# Patient Record
Sex: Female | Born: 1980 | Race: Black or African American | Hispanic: No | Smoking: Never smoker
Health system: Southern US, Community
[De-identification: ages and names within clinical notes are randomized; demographics above are authoritative.]

## PROBLEM LIST (undated history)

## (undated) DIAGNOSIS — E010 Iodine-deficiency related diffuse (endemic) goiter: Secondary | ICD-10-CM

## (undated) DIAGNOSIS — R638 Other symptoms and signs concerning food and fluid intake: Secondary | ICD-10-CM

## (undated) DIAGNOSIS — O139 Gestational [pregnancy-induced] hypertension without significant proteinuria, unspecified trimester: Secondary | ICD-10-CM

## (undated) DIAGNOSIS — IMO0002 Reserved for concepts with insufficient information to code with codable children: Secondary | ICD-10-CM

## (undated) DIAGNOSIS — N921 Excessive and frequent menstruation with irregular cycle: Secondary | ICD-10-CM

## (undated) DIAGNOSIS — I10 Essential (primary) hypertension: Secondary | ICD-10-CM

## (undated) DIAGNOSIS — N63 Unspecified lump in unspecified breast: Secondary | ICD-10-CM

## (undated) DIAGNOSIS — R600 Localized edema: Secondary | ICD-10-CM

## (undated) DIAGNOSIS — D219 Benign neoplasm of connective and other soft tissue, unspecified: Secondary | ICD-10-CM

## (undated) DIAGNOSIS — Z8742 Personal history of other diseases of the female genital tract: Secondary | ICD-10-CM

## (undated) DIAGNOSIS — Z8619 Personal history of other infectious and parasitic diseases: Secondary | ICD-10-CM

## (undated) DIAGNOSIS — N760 Acute vaginitis: Secondary | ICD-10-CM

## (undated) DIAGNOSIS — B9689 Other specified bacterial agents as the cause of diseases classified elsewhere: Secondary | ICD-10-CM

## (undated) DIAGNOSIS — R102 Pelvic and perineal pain: Secondary | ICD-10-CM

## (undated) DIAGNOSIS — Z22330 Carrier of Group B streptococcus: Secondary | ICD-10-CM

## (undated) HISTORY — PX: FOOT SURGERY: SHX648

## (undated) HISTORY — DX: Reserved for concepts with insufficient information to code with codable children: IMO0002

## (undated) HISTORY — DX: Unspecified lump in unspecified breast: N63.0

## (undated) HISTORY — DX: Personal history of other diseases of the female genital tract: Z87.42

## (undated) HISTORY — PX: LIVER SURGERY: SHX698

## (undated) HISTORY — DX: Iodine-deficiency related diffuse (endemic) goiter: E01.0

## (undated) HISTORY — DX: Localized edema: R60.0

## (undated) HISTORY — DX: Gestational (pregnancy-induced) hypertension without significant proteinuria, unspecified trimester: O13.9

## (undated) HISTORY — DX: Other specified bacterial agents as the cause of diseases classified elsewhere: B96.89

## (undated) HISTORY — DX: Benign neoplasm of connective and other soft tissue, unspecified: D21.9

## (undated) HISTORY — DX: Acute vaginitis: N76.0

## (undated) HISTORY — DX: Pelvic and perineal pain: R10.2

## (undated) HISTORY — DX: Other symptoms and signs concerning food and fluid intake: R63.8

## (undated) HISTORY — DX: Excessive and frequent menstruation with irregular cycle: N92.1

## (undated) HISTORY — DX: Carrier of group B Streptococcus: Z22.330

## (undated) HISTORY — DX: Personal history of other infectious and parasitic diseases: Z86.19

## (undated) HISTORY — PX: CERVICAL POLYPECTOMY: SHX88

---

## 1998-09-18 ENCOUNTER — Other Ambulatory Visit: Admission: RE | Admit: 1998-09-18 | Discharge: 1998-09-18 | Payer: Self-pay | Admitting: Family Medicine

## 2000-01-23 ENCOUNTER — Other Ambulatory Visit: Admission: RE | Admit: 2000-01-23 | Discharge: 2000-01-23 | Payer: Self-pay | Admitting: *Deleted

## 2000-04-24 ENCOUNTER — Emergency Department (HOSPITAL_COMMUNITY): Admission: EM | Admit: 2000-04-24 | Discharge: 2000-04-24 | Payer: Self-pay | Admitting: Emergency Medicine

## 2000-09-01 ENCOUNTER — Encounter: Payer: Self-pay | Admitting: Emergency Medicine

## 2000-09-01 ENCOUNTER — Emergency Department (HOSPITAL_COMMUNITY): Admission: EM | Admit: 2000-09-01 | Discharge: 2000-09-01 | Payer: Self-pay | Admitting: Emergency Medicine

## 2000-09-13 ENCOUNTER — Encounter: Admission: RE | Admit: 2000-09-13 | Discharge: 2000-12-12 | Payer: Self-pay | Admitting: Orthopedic Surgery

## 2001-02-06 DIAGNOSIS — E010 Iodine-deficiency related diffuse (endemic) goiter: Secondary | ICD-10-CM

## 2001-02-06 DIAGNOSIS — N921 Excessive and frequent menstruation with irregular cycle: Secondary | ICD-10-CM

## 2001-02-06 DIAGNOSIS — N63 Unspecified lump in unspecified breast: Secondary | ICD-10-CM

## 2001-02-06 HISTORY — DX: Unspecified lump in unspecified breast: N63.0

## 2001-02-06 HISTORY — DX: Excessive and frequent menstruation with irregular cycle: N92.1

## 2001-02-06 HISTORY — DX: Iodine-deficiency related diffuse (endemic) goiter: E01.0

## 2001-02-14 ENCOUNTER — Encounter: Admission: RE | Admit: 2001-02-14 | Discharge: 2001-02-14 | Payer: Self-pay | Admitting: Obstetrics and Gynecology

## 2001-02-14 ENCOUNTER — Encounter: Payer: Self-pay | Admitting: Obstetrics and Gynecology

## 2001-06-08 HISTORY — PX: BUNIONECTOMY: SHX129

## 2002-02-06 DIAGNOSIS — B9689 Other specified bacterial agents as the cause of diseases classified elsewhere: Secondary | ICD-10-CM

## 2002-02-06 HISTORY — DX: Other specified bacterial agents as the cause of diseases classified elsewhere: B96.89

## 2002-06-08 DIAGNOSIS — Z8619 Personal history of other infectious and parasitic diseases: Secondary | ICD-10-CM

## 2002-06-08 HISTORY — DX: Personal history of other infectious and parasitic diseases: Z86.19

## 2003-01-31 ENCOUNTER — Encounter: Payer: Self-pay | Admitting: Endocrinology

## 2003-01-31 ENCOUNTER — Encounter: Admission: RE | Admit: 2003-01-31 | Discharge: 2003-01-31 | Payer: Self-pay | Admitting: Endocrinology

## 2003-04-13 ENCOUNTER — Other Ambulatory Visit: Admission: RE | Admit: 2003-04-13 | Discharge: 2003-04-13 | Payer: Self-pay | Admitting: Obstetrics and Gynecology

## 2004-04-14 ENCOUNTER — Other Ambulatory Visit: Admission: RE | Admit: 2004-04-14 | Discharge: 2004-04-14 | Payer: Self-pay | Admitting: Obstetrics and Gynecology

## 2004-06-08 DIAGNOSIS — Z8742 Personal history of other diseases of the female genital tract: Secondary | ICD-10-CM

## 2004-06-08 HISTORY — DX: Personal history of other diseases of the female genital tract: Z87.42

## 2004-07-08 ENCOUNTER — Ambulatory Visit: Payer: Self-pay | Admitting: Endocrinology

## 2004-07-10 ENCOUNTER — Ambulatory Visit (HOSPITAL_COMMUNITY): Admission: RE | Admit: 2004-07-10 | Discharge: 2004-07-10 | Payer: Self-pay | Admitting: Endocrinology

## 2005-05-07 ENCOUNTER — Other Ambulatory Visit: Admission: RE | Admit: 2005-05-07 | Discharge: 2005-05-07 | Payer: Self-pay | Admitting: Obstetrics and Gynecology

## 2006-03-23 ENCOUNTER — Other Ambulatory Visit: Admission: RE | Admit: 2006-03-23 | Discharge: 2006-03-23 | Payer: Self-pay | Admitting: Family Medicine

## 2007-03-31 ENCOUNTER — Other Ambulatory Visit: Admission: RE | Admit: 2007-03-31 | Discharge: 2007-03-31 | Payer: Self-pay | Admitting: Family Medicine

## 2007-06-09 DIAGNOSIS — R102 Pelvic and perineal pain: Secondary | ICD-10-CM

## 2007-06-09 HISTORY — DX: Pelvic and perineal pain: R10.2

## 2008-05-10 ENCOUNTER — Other Ambulatory Visit: Admission: RE | Admit: 2008-05-10 | Discharge: 2008-05-10 | Payer: Self-pay | Admitting: Family Medicine

## 2008-06-08 DIAGNOSIS — IMO0002 Reserved for concepts with insufficient information to code with codable children: Secondary | ICD-10-CM

## 2008-06-08 DIAGNOSIS — R87619 Unspecified abnormal cytological findings in specimens from cervix uteri: Secondary | ICD-10-CM

## 2008-06-08 HISTORY — DX: Unspecified abnormal cytological findings in specimens from cervix uteri: R87.619

## 2008-06-08 HISTORY — DX: Reserved for concepts with insufficient information to code with codable children: IMO0002

## 2008-11-13 ENCOUNTER — Ambulatory Visit (HOSPITAL_COMMUNITY): Admission: RE | Admit: 2008-11-13 | Discharge: 2008-11-13 | Payer: Self-pay | Admitting: Family Medicine

## 2008-12-05 ENCOUNTER — Ambulatory Visit (HOSPITAL_COMMUNITY): Admission: RE | Admit: 2008-12-05 | Discharge: 2008-12-05 | Payer: Self-pay | Admitting: Obstetrics and Gynecology

## 2008-12-05 ENCOUNTER — Encounter (INDEPENDENT_AMBULATORY_CARE_PROVIDER_SITE_OTHER): Payer: Self-pay | Admitting: Obstetrics and Gynecology

## 2010-09-15 LAB — CBC
HCT: 36.7 % (ref 36.0–46.0)
Hemoglobin: 12.6 g/dL (ref 12.0–15.0)
MCHC: 34.3 g/dL (ref 30.0–36.0)
MCV: 93.1 fL (ref 78.0–100.0)
Platelets: 383 10*3/uL (ref 150–400)
RBC: 3.94 MIL/uL (ref 3.87–5.11)
RDW: 12.9 % (ref 11.5–15.5)
WBC: 5.4 10*3/uL (ref 4.0–10.5)

## 2010-10-21 NOTE — Op Note (Signed)
Chloe Elliott, Chloe Elliott                ACCOUNT NO.:  [id number]   MEDICAL RECORD NO.:  [id number]          PATIENT TYPE:  AMB   LOCATION:  SDC                           FACILITY:  [facility]   PHYSICIAN:  Dois Davenport A. Rivard, M.D. DATE OF BIRTH:  August 11, 1980   DATE OF PROCEDURE:  12/05/2008  DATE OF DISCHARGE:  12/05/2008                               OPERATIVE REPORT   PREOPERATIVE DIAGNOSIS:  Dysfunctional uterine bleeding with endometrial  polyps.   POSTOPERATIVE DIAGNOSIS:  Dysfunctional uterine bleeding with  endometrial polyps with 1 submucosal fibroid.   ANESTHESIA:  General.   PROCEDURE:  Hysteroscopy, resection of polyps, and submucosal fibroid  dilation and curettage.   SURGEON:  Crist Fat. Rivard, MD   ASSISTANT:  No assistant.   ESTIMATED BLOOD LOSS:  Minimal.   PROCEDURE:  After being informed of the planned procedure with possible  complications including bleeding, infection, and injury to uterus,  informed consent is obtained.  The patient is taken to OR #7 given  general anesthesia with laryngeal mask ventilation without complication.  She is placed in lithotomy position, prepped, and draped in a sterile  fashion, and her bladder is emptied with an in-and-out red rubber  catheter.  Pelvic exam reveals a retroverted uterus, slightly increased  in size approximately 12 weeks, mobile and 2 normal adnexa.  A weighted  speculum is inserted.  Anterior lip of the cervix was grasped with a  tenaculum forceps and we proceed with a paracervical block using  Novocaine 1% 18 mL in the usual fashion.  Uterus was sounded at 10 cm  and the cervix was dilated using Hegar dilator until #29 which allows  easy entry of an operative hysteroscope.  With perfusion of sorbitol at  a maximum pressure of 90 mmHg, we are able to visualize the entire  uterine cavity which reveals a slightly thickened posterior wall  endometrium compared to the anterior wall as well as 4 polyps, one of  them is in the  left cornua, one of them is at the fundus anterior, the  other one is on the left wall of the mid body, and the final one is in  the lower uterine segment on the left side.  All polyps are resected  using resectoscope.  We also note a submucosal fibroid mid body  posteriorly measuring approximately 1.5 cm which is resected in 4 passes  easily.  After resection is completed, we proceed with a sharp curette  and curetting the endometrial cavity removing the reminder of the  endometrial lining and finally a final look in the cavity shows a normal  cavity with no remaining polyps.  Hysteroscope was removed.  The cervix  has to be repaired due to 2 laceration created by the tenaculum and this  was done with 4 figure-of-eight stitches of 2-0 Vicryl.   Instrument and sponge count is complete x2.  Estimated blood loss is  minimal.  Water deficit is 200 mL.  Procedure is well tolerated by the  patient, who is taken to recovery room in a well and stable condition.   SPECIMEN:  Endometrial polyps, endometrial curettings, and fragments of  fibroids are sent to Pathology.      Crist Fat Rivard, M.D.  Electronically Signed     SAR/MEDQ  D:  12/05/2008  T:  12/06/2008  Job:  [id number]

## 2010-11-01 ENCOUNTER — Inpatient Hospital Stay (HOSPITAL_COMMUNITY)
Admission: AD | Admit: 2010-11-01 | Discharge: 2010-11-01 | Disposition: A | Discharge status: Home / Self Care | Payer: 59 | Source: Ambulatory Visit | Attending: Obstetrics and Gynecology | Admitting: Obstetrics and Gynecology

## 2010-11-01 ENCOUNTER — Inpatient Hospital Stay (HOSPITAL_COMMUNITY): Payer: 59

## 2010-11-01 DIAGNOSIS — O209 Hemorrhage in early pregnancy, unspecified: Secondary | ICD-10-CM | POA: Insufficient documentation

## 2010-11-01 LAB — CBC
HCT: 37.5 % (ref 36.0–46.0)
MCHC: 33.9 g/dL (ref 30.0–36.0)
MCV: 90.8 fL (ref 78.0–100.0)
Platelets: 324 10*3/uL (ref 150–400)
RDW: 11.9 % (ref 11.5–15.5)
WBC: 5.6 10*3/uL (ref 4.0–10.5)

## 2010-11-01 LAB — URINALYSIS, ROUTINE W REFLEX MICROSCOPIC
Bilirubin Urine: NEGATIVE
Ketones, ur: NEGATIVE mg/dL
Nitrite: NEGATIVE
Protein, ur: NEGATIVE mg/dL
Specific Gravity, Urine: 1.02 (ref 1.005–1.030)
Urobilinogen, UA: 0.2 mg/dL (ref 0.0–1.0)

## 2010-11-01 LAB — HEPATITIS B SURFACE ANTIGEN: Hepatitis B Surface Ag: NEGATIVE

## 2010-11-01 LAB — TYPE AND SCREEN: Antibody Screen: NEGATIVE

## 2010-11-01 LAB — WET PREP, GENITAL
Trich, Wet Prep: NONE SEEN
Yeast Wet Prep HPF POC: NONE SEEN

## 2010-11-04 DIAGNOSIS — IMO0002 Reserved for concepts with insufficient information to code with codable children: Secondary | ICD-10-CM

## 2010-11-04 HISTORY — DX: Reserved for concepts with insufficient information to code with codable children: IMO0002

## 2010-11-04 LAB — GC/CHLAMYDIA PROBE AMP, GENITAL: Chlamydia, DNA Probe: NEGATIVE

## 2011-01-08 LAB — ABO/RH: RH Type: POSITIVE

## 2011-01-08 LAB — HIV ANTIBODY (ROUTINE TESTING W REFLEX): HIV: NONREACTIVE

## 2011-01-08 LAB — RPR: RPR: NONREACTIVE

## 2011-01-08 LAB — RUBELLA ANTIBODY, IGM: Rubella: IMMUNE

## 2011-04-16 ENCOUNTER — Inpatient Hospital Stay (HOSPITAL_COMMUNITY)
Admission: AD | Admit: 2011-04-16 | Discharge: 2011-04-16 | Disposition: A | Discharge status: Home / Self Care | Payer: 59 | Source: Ambulatory Visit | Attending: Obstetrics and Gynecology | Admitting: Obstetrics and Gynecology

## 2011-04-16 ENCOUNTER — Encounter (HOSPITAL_COMMUNITY): Payer: Self-pay

## 2011-04-16 ENCOUNTER — Inpatient Hospital Stay (HOSPITAL_COMMUNITY): Payer: 59

## 2011-04-16 DIAGNOSIS — O26859 Spotting complicating pregnancy, unspecified trimester: Secondary | ICD-10-CM | POA: Insufficient documentation
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
## 2011-04-16 NOTE — Progress Notes (Signed)
Pt states noticed small amount of pink spotting on toilet paper today x1 episode and a small clot in the toilet. Also states have had some braxton hicks today that have not been painful. Denies leaking of fluid or vaginal discharge. Reports positive fetal movement. Was at office today but it was too late for an ultrasound & the doctor couldn't "find her cervix".

## 2011-05-14 ENCOUNTER — Inpatient Hospital Stay (HOSPITAL_COMMUNITY)
Admission: AD | Admit: 2011-05-14 | Discharge: 2011-05-14 | Disposition: A | Discharge status: Home / Self Care | Payer: 59 | Source: Ambulatory Visit | Attending: Obstetrics and Gynecology | Admitting: Obstetrics and Gynecology

## 2011-05-14 ENCOUNTER — Encounter (HOSPITAL_COMMUNITY): Payer: Self-pay

## 2011-05-14 ENCOUNTER — Other Ambulatory Visit: Payer: Self-pay | Admitting: Obstetrics and Gynecology

## 2011-05-14 DIAGNOSIS — Z22330 Carrier of Group B streptococcus: Secondary | ICD-10-CM

## 2011-05-14 DIAGNOSIS — IMO0002 Reserved for concepts with insufficient information to code with codable children: Secondary | ICD-10-CM | POA: Insufficient documentation

## 2011-05-14 DIAGNOSIS — R600 Localized edema: Secondary | ICD-10-CM

## 2011-05-14 DIAGNOSIS — Z3400 Encounter for supervision of normal first pregnancy, unspecified trimester: Secondary | ICD-10-CM

## 2011-05-14 LAB — CBC
HCT: 33.3 % — ABNORMAL LOW (ref 36.0–46.0)
Hemoglobin: 11.3 g/dL — ABNORMAL LOW (ref 12.0–15.0)
MCH: 31.7 pg (ref 26.0–34.0)
MCHC: 33.9 g/dL (ref 30.0–36.0)
MCV: 93.3 fL (ref 78.0–100.0)
RDW: 12.7 % (ref 11.5–15.5)

## 2011-05-14 LAB — COMPREHENSIVE METABOLIC PANEL
Alkaline Phosphatase: 78 U/L (ref 39–117)
BUN: 5 mg/dL — ABNORMAL LOW (ref 6–23)
Creatinine, Ser: 0.57 mg/dL (ref 0.50–1.10)
GFR calc Af Amer: >90 mL/min (ref >90)
Glucose, Bld: 109 mg/dL — ABNORMAL HIGH (ref 70–99)
Potassium: 3.6 mEq/L (ref 3.5–5.1)
Total Protein: 6.8 g/dL (ref 6.0–8.3)

## 2011-05-14 LAB — LACTATE DEHYDROGENASE: LDH: 211 U/L (ref 94–250)

## 2011-05-14 LAB — URIC ACID: Uric Acid, Serum: 3.5 mg/dL (ref 2.4–7.0)
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
## 2011-05-14 NOTE — Progress Notes (Signed)
Patient states she was in the office for a routine visit. Blood pressure was elevated and was sent to MAU for evaluation. Reports good fetal movement.

## 2011-05-14 NOTE — Progress Notes (Signed)
Pt sent from MD office for [facility] eval, labs already drawn, denies h/a, dizziness, blurred vision, no pain, +FM per pt.

## 2011-06-05 ENCOUNTER — Encounter (HOSPITAL_COMMUNITY): Payer: Self-pay | Admitting: *Deleted

## 2011-06-05 ENCOUNTER — Inpatient Hospital Stay (HOSPITAL_COMMUNITY)
Admission: AD | Admit: 2011-06-05 | Discharge: 2011-06-05 | Disposition: A | Discharge status: Home / Self Care | Payer: 59 | Source: Ambulatory Visit | Attending: Obstetrics and Gynecology | Admitting: Obstetrics and Gynecology

## 2011-06-05 DIAGNOSIS — R030 Elevated blood-pressure reading, without diagnosis of hypertension: Secondary | ICD-10-CM | POA: Insufficient documentation

## 2011-06-05 DIAGNOSIS — O139 Gestational [pregnancy-induced] hypertension without significant proteinuria, unspecified trimester: Secondary | ICD-10-CM | POA: Insufficient documentation

## 2011-06-05 DIAGNOSIS — IMO0002 Reserved for concepts with insufficient information to code with codable children: Secondary | ICD-10-CM

## 2011-06-05 DIAGNOSIS — O2690 Pregnancy related conditions, unspecified, unspecified trimester: Secondary | ICD-10-CM | POA: Insufficient documentation

## 2011-06-05 HISTORY — DX: Gestational (pregnancy-induced) hypertension without significant proteinuria, unspecified trimester: O13.9

## 2011-06-05 LAB — DIFFERENTIAL
Basophils Relative: 0 % (ref 0–1)
Eosinophils Absolute: 0.1 10*3/uL (ref 0.0–0.7)
Eosinophils Relative: 2 % (ref 0–5)
Lymphs Abs: 1.5 10*3/uL (ref 0.7–4.0)
Monocytes Absolute: 0.8 10*3/uL (ref 0.1–1.0)
Monocytes Relative: 9 % (ref 3–12)
Neutrophils Relative %: 74 % (ref 43–77)

## 2011-06-05 LAB — COMPREHENSIVE METABOLIC PANEL
ALT: 15 U/L (ref 0–35)
AST: 19 U/L (ref 0–37)
Albumin: 2.6 g/dL — ABNORMAL LOW (ref 3.5–5.2)
Alkaline Phosphatase: 87 U/L (ref 39–117)
Calcium: 9.4 mg/dL (ref 8.4–10.5)
GFR calc Af Amer: >90 mL/min (ref >90)
Glucose, Bld: 83 mg/dL (ref 70–99)
Potassium: 3.6 mEq/L (ref 3.5–5.1)
Sodium: 135 mEq/L (ref 135–145)
Total Protein: 6.2 g/dL (ref 6.0–8.3)

## 2011-06-05 LAB — URINALYSIS, ROUTINE W REFLEX MICROSCOPIC
Glucose, UA: 500 mg/dL — AB
Hgb urine dipstick: NEGATIVE
Ketones, ur: NEGATIVE mg/dL
Leukocytes, UA: NEGATIVE
Protein, ur: NEGATIVE mg/dL
pH: 6 (ref 5.0–8.0)

## 2011-06-05 LAB — CBC
HCT: 32.3 % — ABNORMAL LOW (ref 36.0–46.0)
Hemoglobin: 11.3 g/dL — ABNORMAL LOW (ref 12.0–15.0)
MCH: 32.4 pg (ref 26.0–34.0)
MCHC: 35 g/dL (ref 30.0–36.0)
MCV: 92.6 fL (ref 78.0–100.0)
RBC: 3.49 MIL/uL — ABNORMAL LOW (ref 3.87–5.11)

## 2011-06-05 NOTE — Progress Notes (Signed)
Patient was sent from the office for evaluation of elevated blood pressure. Reports good fetal movement, no contractions, bleeding or leaking. Does have some lower abdominal pressure.
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
## 2011-06-06 LAB — CREATININE CLEARANCE, URINE, 24 HOUR
Creatinine, 24H Ur: 2295 mg/d — ABNORMAL HIGH (ref 700–1800)
Creatinine, Urine: 95.64 mg/dL
Creatinine: 0.56 mg/dL (ref 0.50–1.10)

## 2011-06-07 LAB — PROTEIN, URINE, 24 HOUR: Urine Total Volume-UPROT: 2400 mL

## 2011-06-09 DIAGNOSIS — R638 Other symptoms and signs concerning food and fluid intake: Secondary | ICD-10-CM

## 2011-06-09 HISTORY — DX: Other symptoms and signs concerning food and fluid intake: R63.8

## 2011-06-09 NOTE — L&D Delivery Note (Signed)
Delivery Note At 5:43 PM a viable female was delivered via Vaginal, in L tilt position with McRoberts after 2 hours pushing. Spontaneous Delivery (Presentation: Left Occiput Anterior).  APGAR: 9, 9; weight 7 lb (3175 g).   Placenta status: Intact, Spontaneous, schultze Cord: 3 vessels with the following complications:Iand O cath 150 ml at delivery. Cord pH: na. Dr. Pennie Rushing attended delivery.  Anesthesia: Epidural  Episiotomy: Median Lacerations:  Suture Repair: 3.0 chromic Est. Blood Loss (mL): 500  Mom to postpartum.  Baby to rooming in skin to skin.  Chloe Elliott 06/14/2011, 6:29 PM

## 2011-06-13 ENCOUNTER — Encounter (HOSPITAL_COMMUNITY): Payer: Self-pay | Admitting: *Deleted

## 2011-06-13 ENCOUNTER — Inpatient Hospital Stay (HOSPITAL_COMMUNITY)
Admission: AD | Admit: 2011-06-13 | Discharge: 2011-06-16 | DRG: 775 | Disposition: A | Discharge status: Home / Self Care | Payer: 59 | Source: Ambulatory Visit | Attending: Obstetrics and Gynecology | Admitting: Obstetrics and Gynecology

## 2011-06-13 DIAGNOSIS — IMO0002 Reserved for concepts with insufficient information to code with codable children: Secondary | ICD-10-CM | POA: Diagnosis present

## 2011-06-13 DIAGNOSIS — O4290 Premature rupture of membranes, unspecified as to length of time between rupture and onset of labor, unspecified weeks of gestation: Principal | ICD-10-CM | POA: Diagnosis present

## 2011-06-13 DIAGNOSIS — O139 Gestational [pregnancy-induced] hypertension without significant proteinuria, unspecified trimester: Secondary | ICD-10-CM | POA: Diagnosis not present

## 2011-06-13 DIAGNOSIS — Z22330 Carrier of Group B streptococcus: Secondary | ICD-10-CM

## 2011-06-13 DIAGNOSIS — O9081 Anemia of the puerperium: Secondary | ICD-10-CM | POA: Diagnosis not present

## 2011-06-13 DIAGNOSIS — R030 Elevated blood-pressure reading, without diagnosis of hypertension: Secondary | ICD-10-CM | POA: Diagnosis present

## 2011-06-13 DIAGNOSIS — O99892 Other specified diseases and conditions complicating childbirth: Secondary | ICD-10-CM | POA: Diagnosis present

## 2011-06-13 LAB — CBC
MCH: 32.6 pg (ref 26.0–34.0)
MCHC: 34.9 g/dL (ref 30.0–36.0)
MCV: 93.5 fL (ref 78.0–100.0)
Platelets: 264 10*3/uL (ref 150–400)
RBC: 3.71 MIL/uL — ABNORMAL LOW (ref 3.87–5.11)

## 2011-06-13 LAB — AMNISURE RUPTURE OF MEMBRANE (ROM) NOT AT ARMC: Amnisure ROM: POSITIVE

## 2011-06-13 MED ORDER — OXYCODONE-ACETAMINOPHEN 5-325 MG PO TABS: 2.0000 | ORAL_TABLET | ORAL | Status: DC | PRN

## 2011-06-13 MED ORDER — LIDOCAINE HCL (PF) 1 % IJ SOLN
30.0000 mL | INTRAMUSCULAR | Status: DC | PRN
  Filled 2011-06-13: qty 30

## 2011-06-13 MED ORDER — BISACODYL 10 MG RE SUPP
10.0000 mg | RECTAL | Status: AC
  Administered 2011-06-13: 10 mg via RECTAL
  Filled 2011-06-13: qty 1

## 2011-06-13 MED ORDER — BUTORPHANOL TARTRATE 2 MG/ML IJ SOLN
2.0000 mg | INTRAMUSCULAR | Status: DC | PRN
  Administered 2011-06-14: 2 mg via INTRAVENOUS
  Filled 2011-06-13: qty 1

## 2011-06-13 MED ORDER — MISOPROSTOL 25 MCG QUARTER TABLET: 50.0000 ug | ORAL_TABLET | ORAL | Status: DC

## 2011-06-13 MED ORDER — IBUPROFEN 600 MG PO TABS: 600.0000 mg | ORAL_TABLET | Freq: Four times a day (QID) | ORAL | Status: DC | PRN

## 2011-06-13 MED ORDER — CITRIC ACID-SODIUM CITRATE 334-500 MG/5ML PO SOLN
30.0000 mL | ORAL | Status: DC | PRN
  Filled 2011-06-13: qty 15

## 2011-06-13 MED ORDER — ACETAMINOPHEN 325 MG PO TABS: 650.0000 mg | ORAL_TABLET | ORAL | Status: DC | PRN

## 2011-06-13 MED ORDER — PENICILLIN G POTASSIUM 5000000 UNITS IJ SOLR
2.5000 10*6.[IU] | INTRAVENOUS | Status: DC
  Administered 2011-06-13 – 2011-06-14 (×6): 2.5 10*6.[IU] via INTRAVENOUS
  Filled 2011-06-13 (×10): qty 2.5

## 2011-06-13 MED ORDER — PROMETHAZINE HCL 25 MG/ML IJ SOLN: 12.5000 mg | INTRAMUSCULAR | Status: DC | PRN

## 2011-06-13 MED ORDER — OXYTOCIN BOLUS FROM INFUSION
500.0000 mL | Freq: Once | INTRAVENOUS | Status: DC
  Filled 2011-06-13: qty 500

## 2011-06-13 MED ORDER — TERBUTALINE SULFATE 1 MG/ML IJ SOLN: 0.2500 mg | Freq: Once | INTRAMUSCULAR | Status: AC | PRN

## 2011-06-13 MED ORDER — MISOPROSTOL 25 MCG QUARTER TABLET
50.0000 ug | ORAL_TABLET | ORAL | Status: AC
  Administered 2011-06-13 – 2011-06-14 (×2): 50 ug via ORAL
  Filled 2011-06-13 (×2): qty 0.5

## 2011-06-13 MED ORDER — ZOLPIDEM TARTRATE 10 MG PO TABS
10.0000 mg | ORAL_TABLET | Freq: Once | ORAL | Status: AC
Start: 2011-06-13 — End: 2011-06-13
  Administered 2011-06-13: 10 mg via ORAL
  Filled 2011-06-13: qty 1

## 2011-06-13 MED ORDER — LACTATED RINGERS IV SOLN: 500.0000 mL | INTRAVENOUS | Status: DC | PRN

## 2011-06-13 MED ORDER — LACTATED RINGERS IV SOLN
INTRAVENOUS | Status: DC
  Administered 2011-06-13 – 2011-06-14 (×4): via INTRAVENOUS

## 2011-06-13 MED ORDER — OXYTOCIN 20 UNITS IN LACTATED RINGERS INFUSION - SIMPLE: 1.0000 m[IU]/min | INTRAVENOUS | Status: DC

## 2011-06-13 MED ORDER — ONDANSETRON HCL 4 MG/2ML IJ SOLN
4.0000 mg | Freq: Four times a day (QID) | INTRAMUSCULAR | Status: DC | PRN
  Administered 2011-06-14: 4 mg via INTRAVENOUS
  Filled 2011-06-13: qty 2

## 2011-06-13 MED ORDER — OXYTOCIN 20 UNITS IN LACTATED RINGERS INFUSION - SIMPLE
125.0000 mL/h | Freq: Once | INTRAVENOUS | Status: AC
  Administered 2011-06-14: 125 mL/h via INTRAVENOUS

## 2011-06-13 MED ORDER — FLEET ENEMA 7-19 GM/118ML RE ENEM: 1.0000 | ENEMA | RECTAL | Status: DC | PRN

## 2011-06-13 MED ORDER — PENICILLIN G POTASSIUM 5000000 UNITS IJ SOLR
5.0000 10*6.[IU] | Freq: Once | INTRAVENOUS | Status: AC
  Administered 2011-06-13: 5 10*6.[IU] via INTRAVENOUS
  Filled 2011-06-13: qty 5

## 2011-06-13 MED ORDER — DINOPROSTONE 10 MG VA INST
10.0000 mg | VAGINAL_INSERT | Freq: Once | VAGINAL | Status: AC
  Administered 2011-06-13: 10 mg via VAGINAL
  Filled 2011-06-13: qty 1

## 2011-06-13 NOTE — Progress Notes (Signed)
Comfortable some cramping with voids only O VSS      Fhts 130-140s LTV mod accels      uc q 2-4 to irritability difficult tracing      Vag not reassessed A   PPROM x 12 hours      Cervical ripening P   Continue care, Dr. Estanislado Pandy updated per telephone on pt status plan to re evaluate with removal of cervidil due out at 2240. Lavera Guise, CNM

## 2011-06-13 NOTE — Progress Notes (Signed)
Monitoring off per CNM for pt to rest--will reapply after an hour of being off--then monitoring changed to pt having 20 mins off monitors per hour--per Corrie Dandy, CNM

## 2011-06-13 NOTE — Progress Notes (Signed)
1930 Patient walking in hallway. 1945 patient returned to room and her meal was waiting for her. She requested that we delay assessment until she finishes eating. Spoke with Chip Boer, she will check patient at 2130 and remove cervidil. Plan of care to be determined at that time. Patient may remain off the monitor until 2130 or exam is complete. Candise Che, RN

## 2011-06-13 NOTE — Progress Notes (Signed)
Report called to [facility] in BS. Pt to BS ambulatory with NT.

## 2011-06-13 NOTE — Progress Notes (Signed)
  Subjective: Doing well.  Occasional contractions.  Family at bedside.  Objective: BP 143/77  Pulse 96  Temp(Src) 98.4 F (36.9 C) (Oral)  Resp 18  Ht 5\' 9"  (1.753 m)  Wt 121.11 kg (267 lb)  BMI 39.43 kg/m2      FHT:  Category 1  UC:   Very occasional  Labs: Lab Results  Component Value Date   WBC 9.4 06/13/2011   HGB 12.1 06/13/2011   HCT 34.7* 06/13/2011   MCV 93.5 06/13/2011   PLT 264 06/13/2011    Assessment / Plan: IUP at 37 2/7weeks PROM without labor, unfavorable cervix + GBS--on prophylaxis  Plan: Will re-evaluate cervix with removal of Cervidil at 10:40pm--anticipate further ripening.   Chloe Elliott 06/13/2011, 8:34 PM
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
## 2011-06-13 NOTE — Progress Notes (Signed)
  Subjective: Denies contractions.  Leaking small amount clear fluid.  Feeling some pressure in rectum.  Objective: BP 128/79  Pulse 95  Temp(Src) 98.3 F (36.8 C) (Oral)  Resp 18  Ht 5\' 9"  (1.753 m)  Wt 121.11 kg (267 lb)  BMI 39.43 kg/m2      FHT:  Category 1 UC:   none SVE:  Closed, thick, posterior, firm.  Moderate amount stool in rectum Cervidil removed.  Assessment / Plan: PPROM x 16-17 hours, no labor Minimal cervical ripening with Cervidil Category 1 FHR Constipation  Plan: Will use po Cytotech 50 mcg q 4 hours for at least 2 doses, then re-evaluate for 3rd dose vs initiation of pitocin. Dulcolax suppository now Ambien after suppository results. Re-evaluate in am or prn. Patient and husband agreeable with plan.   Nigel Bridgeman  06/13/2011, 10:09 PM

## 2011-06-13 NOTE — Progress Notes (Signed)
G1 at 37.2wks. Leaking fld since 0549. Clear fld. Some mild cramping. B/P elevated some at end of pregnancy

## 2011-06-14 ENCOUNTER — Encounter (HOSPITAL_COMMUNITY): Payer: Self-pay | Admitting: Anesthesiology

## 2011-06-14 ENCOUNTER — Encounter (HOSPITAL_COMMUNITY): Payer: Self-pay | Admitting: *Deleted

## 2011-06-14 ENCOUNTER — Inpatient Hospital Stay (HOSPITAL_COMMUNITY): Payer: 59 | Admitting: Anesthesiology

## 2011-06-14 LAB — CBC
MCH: 32.2 pg (ref 26.0–34.0)
MCHC: 34.6 g/dL (ref 30.0–36.0)
Platelets: 246 10*3/uL (ref 150–400)
RDW: 12.9 % (ref 11.5–15.5)

## 2011-06-14 MED ORDER — ONDANSETRON HCL 4 MG PO TABS: 4.0000 mg | ORAL_TABLET | ORAL | Status: DC | PRN

## 2011-06-14 MED ORDER — PRENATAL PLUS 27-1 MG PO TABS: 1.0000 | ORAL_TABLET | Freq: Every day | ORAL | Status: DC

## 2011-06-14 MED ORDER — OXYTOCIN 20 UNITS IN LACTATED RINGERS INFUSION - SIMPLE: 1.0000 m[IU]/min | INTRAVENOUS | Status: DC

## 2011-06-14 MED ORDER — FENTANYL 2.5 MCG/ML BUPIVACAINE 1/10 % EPIDURAL INFUSION (WH - ANES)
14.0000 mL/h | INTRAMUSCULAR | Status: DC
  Administered 2011-06-14 (×2): 14 mL/h via EPIDURAL
  Filled 2011-06-14 (×3): qty 60

## 2011-06-14 MED ORDER — BENZOCAINE-MENTHOL 20-0.5 % EX AERO
INHALATION_SPRAY | CUTANEOUS | Status: AC
  Administered 2011-06-15: 02:00:00
  Filled 2011-06-14: qty 56

## 2011-06-14 MED ORDER — PRENATAL MULTIVITAMIN CH
1.0000 | ORAL_TABLET | Freq: Every day | ORAL | Status: DC
  Administered 2011-06-16: 1 via ORAL
  Filled 2011-06-14: qty 1

## 2011-06-14 MED ORDER — WITCH HAZEL-GLYCERIN EX PADS: 1.0000 "application " | MEDICATED_PAD | CUTANEOUS | Status: DC | PRN

## 2011-06-14 MED ORDER — OXYTOCIN 20 UNITS IN LACTATED RINGERS INFUSION - SIMPLE
1.0000 m[IU]/min | INTRAVENOUS | Status: DC
  Administered 2011-06-14: 1 m[IU]/min via INTRAVENOUS
  Filled 2011-06-14: qty 1000

## 2011-06-14 MED ORDER — SODIUM BICARBONATE 8.4 % IV SOLN
INTRAVENOUS | Status: AC
  Filled 2011-06-14: qty 50

## 2011-06-14 MED ORDER — EPHEDRINE 5 MG/ML INJ
10.0000 mg | INTRAVENOUS | Status: DC | PRN
  Filled 2011-06-14: qty 4

## 2011-06-14 MED ORDER — LANOLIN HYDROUS EX OINT: TOPICAL_OINTMENT | CUTANEOUS | Status: DC | PRN

## 2011-06-14 MED ORDER — DIPHENHYDRAMINE HCL 25 MG PO CAPS: 25.0000 mg | ORAL_CAPSULE | Freq: Four times a day (QID) | ORAL | Status: DC | PRN

## 2011-06-14 MED ORDER — FENTANYL 2.5 MCG/ML BUPIVACAINE 1/10 % EPIDURAL INFUSION (WH - ANES)
INTRAMUSCULAR | Status: DC | PRN
  Administered 2011-06-14: 14 mL/h via EPIDURAL

## 2011-06-14 MED ORDER — ZOLPIDEM TARTRATE 5 MG PO TABS: 5.0000 mg | ORAL_TABLET | Freq: Every evening | ORAL | Status: DC | PRN

## 2011-06-14 MED ORDER — PHENYLEPHRINE 40 MCG/ML (10ML) SYRINGE FOR IV PUSH (FOR BLOOD PRESSURE SUPPORT)
80.0000 ug | PREFILLED_SYRINGE | INTRAVENOUS | Status: DC | PRN
  Filled 2011-06-14: qty 5

## 2011-06-14 MED ORDER — SENNOSIDES-DOCUSATE SODIUM 8.6-50 MG PO TABS
2.0000 | ORAL_TABLET | Freq: Every day | ORAL | Status: DC
  Administered 2011-06-15: 2 via ORAL

## 2011-06-14 MED ORDER — LIDOCAINE HCL 1.5 % IJ SOLN
INTRAMUSCULAR | Status: DC | PRN
  Administered 2011-06-14 (×2): 4 mL via EPIDURAL

## 2011-06-14 MED ORDER — ONDANSETRON HCL 4 MG/2ML IJ SOLN: 4.0000 mg | INTRAMUSCULAR | Status: DC | PRN

## 2011-06-14 MED ORDER — FLEET ENEMA 7-19 GM/118ML RE ENEM
1.0000 | ENEMA | RECTAL | Status: AC
  Administered 2011-06-14: 1 via RECTAL

## 2011-06-14 MED ORDER — OXYCODONE-ACETAMINOPHEN 5-325 MG PO TABS
1.0000 | ORAL_TABLET | ORAL | Status: DC | PRN
  Administered 2011-06-15: 1 via ORAL
  Filled 2011-06-14 (×2): qty 1

## 2011-06-14 MED ORDER — LIDOCAINE-EPINEPHRINE (PF) 2 %-1:200000 IJ SOLN
INTRAMUSCULAR | Status: AC
  Filled 2011-06-14: qty 20

## 2011-06-14 MED ORDER — PHENYLEPHRINE 40 MCG/ML (10ML) SYRINGE FOR IV PUSH (FOR BLOOD PRESSURE SUPPORT): 80.0000 ug | PREFILLED_SYRINGE | INTRAVENOUS | Status: DC | PRN

## 2011-06-14 MED ORDER — DIBUCAINE 1 % RE OINT: 1.0000 "application " | TOPICAL_OINTMENT | RECTAL | Status: DC | PRN

## 2011-06-14 MED ORDER — SIMETHICONE 80 MG PO CHEW: 80.0000 mg | CHEWABLE_TABLET | ORAL | Status: DC | PRN

## 2011-06-14 MED ORDER — EPHEDRINE 5 MG/ML INJ: 10.0000 mg | INTRAVENOUS | Status: DC | PRN

## 2011-06-14 MED ORDER — DIPHENHYDRAMINE HCL 50 MG/ML IJ SOLN: 12.5000 mg | INTRAMUSCULAR | Status: DC | PRN

## 2011-06-14 MED ORDER — BENZOCAINE-MENTHOL 20-0.5 % EX AERO
1.0000 "application " | INHALATION_SPRAY | CUTANEOUS | Status: DC | PRN
  Administered 2011-06-16: 1 via TOPICAL

## 2011-06-14 MED ORDER — MISOPROSTOL 200 MCG PO TABS
ORAL_TABLET | ORAL | Status: AC
Start: 2011-06-14 — End: 2011-06-15
  Filled 2011-06-14: qty 5

## 2011-06-14 MED ORDER — SODIUM BICARBONATE 8.4 % IV SOLN
INTRAVENOUS | Status: DC | PRN
  Administered 2011-06-14: 8 mL via EPIDURAL

## 2011-06-14 MED ORDER — TETANUS-DIPHTH-ACELL PERTUSSIS 5-2.5-18.5 LF-MCG/0.5 IM SUSP
0.5000 mL | Freq: Once | INTRAMUSCULAR | Status: AC
  Administered 2011-06-16: 0.5 mL via INTRAMUSCULAR
  Filled 2011-06-14: qty 0.5

## 2011-06-14 MED ORDER — LACTATED RINGERS IV SOLN
500.0000 mL | Freq: Once | INTRAVENOUS | Status: AC
  Administered 2011-06-14: 500 mL via INTRAVENOUS

## 2011-06-14 MED ORDER — IBUPROFEN 600 MG PO TABS
600.0000 mg | ORAL_TABLET | Freq: Four times a day (QID) | ORAL | Status: DC
  Administered 2011-06-15 – 2011-06-16 (×7): 600 mg via ORAL
  Filled 2011-06-14 (×7): qty 1

## 2011-06-14 NOTE — Progress Notes (Signed)
S:  Just received IV pain medication--had moderate amount stool after enema.  Very sleepy.  O:  VSS. Afebrile.       FHR Category 1       UCs irregular at present, not tracing well, palpate as mild.  A:  PPROM x 24 hours      Early labor, s/p Cervidil and 2 doses oral cytotech (LD 2:30am)      Positive GBS  P:  Start pitocin per low-dose protocol this am       Further pain medication as needed.  Nigel Bridgeman, CNM, [place] 06/14/11 6:20am

## 2011-06-14 NOTE — Progress Notes (Signed)
Pressure with contractions O Fhts 120-130s LTV min to mod early decels     uc q 2-4 mod     Vag C +2 little movement with pushing A 2nd stage P labor down Lavera Guise, CNM

## 2011-06-14 NOTE — Anesthesia Preprocedure Evaluation (Addendum)
Anesthesia Evaluation  Patient identified by MRN, date of birth, ID band Patient awake    Reviewed: Allergy & Precautions, H&P , Patient's Chart, lab work & pertinent test results  Airway Mallampati: III TM Distance: >3 FB Neck ROM: full    Dental No notable dental hx. (+) Teeth Intact   Pulmonary neg pulmonary ROS,  clear to auscultation  Pulmonary exam normal       Cardiovascular hypertension, On Medications and Pt. on medications neg cardio ROS regular Normal    Neuro/Psych Negative Neurological ROS  Negative Psych ROS   GI/Hepatic negative GI ROS, Neg liver ROS,   Endo/Other  Negative Endocrine ROSMorbid obesity  Renal/GU negative Renal ROS  Genitourinary negative   Musculoskeletal   Abdominal Normal abdominal exam  (+)   Peds  Hematology negative hematology ROS (+)   Anesthesia Other Findings   Reproductive/Obstetrics (+) Pregnancy                          Anesthesia Physical Anesthesia Plan  ASA: III  Anesthesia Plan: Epidural   Post-op Pain Management:    Induction:   Airway Management Planned:   Additional Equipment:   Intra-op Plan:   Post-operative Plan:   Informed Consent: I have reviewed the patients History and Physical, chart, labs and discussed the procedure including the risks, benefits and alternatives for the proposed anesthesia with the patient or authorized representative who has indicated his/her understanding and acceptance.     Plan Discussed with: Anesthesiologist and Surgeon  Anesthesia Plan Comments:         Anesthesia Quick Evaluation

## 2011-06-14 NOTE — Anesthesia Procedure Notes (Signed)
Epidural Patient location during procedure: OB Start time: 06/14/2011 8:51 AM  Staffing Anesthesiologist: Breanda Greenlaw A. Performed by: anesthesiologist   Preanesthetic Checklist Completed: patient identified, site marked, surgical consent, pre-op evaluation, timeout performed, IV checked, risks and benefits discussed and monitors and equipment checked  Epidural Patient position: sitting Prep: site prepped and draped and DuraPrep Patient monitoring: continuous pulse ox and blood pressure Approach: midline Injection technique: LOR air  Needle:  Needle type: Tuohy  Needle gauge: 17 G Needle length: 9 cm Needle insertion depth: 7 cm Catheter type: closed end flexible Catheter size: 19 Gauge Catheter at skin depth: 12 cm Test dose: negative and 1.5% lidocaine  Assessment Events: blood not aspirated, injection not painful, no injection resistance, negative IV test and no paresthesia  Additional Notes Patient is more comfortable after epidural dosed. Please see RN's note for documentation of vital signs and FHR which are stable.

## 2011-06-14 NOTE — Progress Notes (Signed)
Complaints of L sided pain low with contractions O Fhts 140s LTV mod     uc q 1-4 mod     Vag 8 100 0 VTX R clear A  Active labor     PPROM 31 hours P epidural redose and labor down discussed. Lavera Guise, CNM

## 2011-06-14 NOTE — Progress Notes (Signed)
  Subjective: Sleeping at intervals.  Had good results from Dulcolax suppository.  No significant contractions.  Objective: BP 133/79  Pulse 89  Temp(Src) 98.3 F (36.8 C) (Oral)  Resp 18  Ht 5\' 9"  (1.753 m)  Wt 121.11 kg (267 lb)  BMI 39.43 kg/m2      FHT:  Category 1 tracing UC:   Very occasional Addendum to last note:  Vtx -1 station  Assessment / Plan: PPROM, s/p 10 hours cervidil, 1st dose oral cytotech at 10:30pm 2nd dose cytotech at 2:30am. If no significant change in contraction pattern, will give 3rd dose cytotech at 6:30am.  Chloe Elliott 06/14/2011, 2:15 AM

## 2011-06-14 NOTE — Progress Notes (Signed)
S:  More uncomfortable now--began to have more contractions around 3am, got up and walked for approx 1 hour, now returned to bed for evaluation.  Still feels stool in rectum.  Received 2nd dose oral cytotech at 2:30am.  O:   Filed Vitals:   06/14/11 0100 06/14/11 0130 06/14/11 0200 06/14/11 0230  BP: 141/81 133/79 111/59 129/79  Pulse: 97 89 79 81  Temp:      TempSrc:      Resp: 18 18 18 18   Height:      Weight:       Monitor reapplied--FHR 150. UCs q 2-4 min per palpation  Cx posterior, 2 cm, 80%, vtx -1--hard stool still present in rectum, unable to express with vaginal exam.  A:  PPROM, now approx 23 hours post-rupture  P:  Will do Fleets enema, then consider IV pain medication if desired.       Patient plans epidural as labor advances.  Nigel Bridgeman, CNM, [place] 06/14/11 5am

## 2011-06-14 NOTE — Progress Notes (Signed)
Patient up to bathroom with increasing frequency, between 30-20 minutes, and voiding small amounts. Candise Che, RN

## 2011-06-14 NOTE — Progress Notes (Signed)
Pt remains uncomfortable--CNM notified to assess pt--orders to not perform SVE and call Anesthesia to re-dose epidural--Anesthesia at bedside and not comfortable dosing epidural with unknown labor progression--CNM notified and in route to evaluate

## 2011-06-14 NOTE — Progress Notes (Signed)
Chloe Elliott is a 31 y.o. G1P0000 at [redacted]w[redacted]d   Subjective:   Objective: BP 105/63  Pulse 88  Temp(Src) 98.1 F (36.7 C) (Axillary)  Resp 18  Ht 5\' 9"  (1.753 m)  Wt 267 lb (121.11 kg)  BMI 39.43 kg/m2  SpO2 97%      FHT:  FHR: 130 bpm, variability: moderate,  accelerations:  Abscent,  decelerations:  Absent UC:   regular, every 1-5 minutes mild SVE:   Dilation: 2 Effacement (%): 70 Station: -3 Exam by:: J.Thornton, RN  Labs: Lab Results  Component Value Date   WBC 11.4* 06/14/2011   HGB 11.8* 06/14/2011   HCT 34.1* 06/14/2011   MCV 93.2 06/14/2011   PLT 246 06/14/2011    Assessment / Plan: PPROM 27 hour Plan: continue care, titrate IV Pitocin. Jameila Keeny 06/14/2011, 9:17 AM

## 2011-06-15 DIAGNOSIS — IMO0002 Reserved for concepts with insufficient information to code with codable children: Secondary | ICD-10-CM | POA: Diagnosis present

## 2011-06-15 LAB — CBC
MCV: 93.3 fL (ref 78.0–100.0)
Platelets: 188 10*3/uL (ref 150–400)
RDW: 12.9 % (ref 11.5–15.5)
WBC: 12.7 10*3/uL — ABNORMAL HIGH (ref 4.0–10.5)

## 2011-06-15 NOTE — Progress Notes (Signed)
Post Partum Day 1 Subjective:  Well. Lochia are normal. Voiding, ambulating, tolerating normal diet. Working on breastfeeding  Objective: Blood pressure 93/68, pulse 120, temperature 97.9 F (36.6 C), temperature source Oral, resp. rate 20, height 5\' 9"  (1.753 m), weight 121.11 kg (267 lb), SpO2 96 %  Physical Exam:  General: normal Lochia: appropriate Uterine Fundus: 0/1 firm non-tender  Extremities: No evidence of DVT seen on physical exam. Edema moderate.     Basename 06/15/11 0510 06/14/11 0750  HGB 8.6* 11.8*  HCT 25.2* 34.1*    Assessment/Plan: Normal Post-partum. Continue routine post-partum care. Circumcision procedure and care reviewed with parents Anticipate discharge tomorrow    LOS: 2 days   Monalisa Bayless A MD 06/15/2011, 11:14 AM

## 2011-06-15 NOTE — Anesthesia Postprocedure Evaluation (Signed)
  Anesthesia Post-op Note  Patient: Chloe Elliott  Procedure(s) Performed: * No procedures listed *  Patient Location: PACU and Mother/Baby  Anesthesia Type: Epidural  Level of Consciousness: awake and alert   Airway and Oxygen Therapy: Patient Spontanous Breathing  Post-op Pain: none  Post-op Assessment: Post-op Vital signs reviewed and Patient's Cardiovascular Status Stable  Post-op Vital Signs: Reviewed and stable  Complications: No apparent anesthesia complications  Post op visit by Karleen Dolphin, CRNA

## 2011-06-15 NOTE — Progress Notes (Signed)
UR chart review completed.  

## 2011-06-15 NOTE — Progress Notes (Signed)
Pt doing well, seen by Dr Estanislado Pandy Discussed contraception with pt and desires to use condoms, may consider micronor Recommended FE supplement Floridex  Plan D/C tomorrow

## 2011-06-16 DIAGNOSIS — O9081 Anemia of the puerperium: Secondary | ICD-10-CM | POA: Diagnosis not present

## 2011-06-16 DIAGNOSIS — E669 Obesity, unspecified: Secondary | ICD-10-CM | POA: Insufficient documentation

## 2011-06-16 DIAGNOSIS — O4290 Premature rupture of membranes, unspecified as to length of time between rupture and onset of labor, unspecified weeks of gestation: Secondary | ICD-10-CM | POA: Diagnosis not present

## 2011-06-16 DIAGNOSIS — Z22330 Carrier of Group B streptococcus: Secondary | ICD-10-CM

## 2011-06-16 MED ORDER — IBUPROFEN 600 MG PO TABS: 600.0000 mg | ORAL_TABLET | Freq: Four times a day (QID) | ORAL | Status: AC

## 2011-06-16 MED ORDER — BENZOCAINE-MENTHOL 20-0.5 % EX AERO
INHALATION_SPRAY | CUTANEOUS | Status: AC
  Filled 2011-06-16: qty 56

## 2011-06-16 NOTE — Progress Notes (Signed)
Post Partum Day 2 Subjective: up ad lib, voiding, tolerating PO, + flatus and no BM since Sat (3 days).  Declines contraception;  Husband and parents at Bedside and supportive.  VB tapering.  No PIH s/s and no dizziness or syncope but feels 'Weak" at times.    Objective: Blood pressure 110/69, pulse 87, temperature 98.5 F (36.9 C), temperature source Oral, resp. rate 20, height 5\' 9"  (1.753 m), weight 121.11 kg (267 lb), SpO2 96.00%, unknown if currently breastfeeding.  Physical Exam:  General: alert, cooperative, no distress and moderately obese Lochia: appropriate Uterine Fundus: firm, below umbilicus Incision: n/a DVT Evaluation: No evidence of DVT seen on physical exam. Negative Homan's sign. Calf/Ankle edema is present.   Basename 06/15/11 0510 06/14/11 0750  HGB 8.6* 11.8*  HCT 25.2* 34.1*    Assessment/Plan: Discharge home, Breastfeeding, Circumcision prior to discharge and Contraception Declines.  PP anemia w/o s/s. Prolonged ROM; GBS positive; Constipation; Obese; PIH, but suspect CHTN. D/c home w/ PIH precautions.  Smart Start BP before the end of this week, and weekly thereafter.  F/u 6 weeks at [facility] or prn.  LOS: 3 days   Shaylea Ucci H 06/16/2011, 11:44 AM

## 2011-06-16 NOTE — Discharge Summary (Signed)
Obstetric Discharge Summary Reason for Admission: rupture of membranes Prenatal Procedures: NST, ultrasound and PIH workups Intrapartum Procedures: spontaneous vaginal delivery, GBS prophylaxis and epidural; median episiotomy; prostaglandin induction w/ cervidil x1 and po cytotec x2 Postpartum Procedures: none Complications-Operative and Postpartum: PP anemia Hemoglobin  Date Value Range Status  06/15/2011 8.6* 12.0-15.0 (g/dL) Final     DELTA CHECK NOTED     REPEATED TO VERIFY     HCT  Date Value Range Status  06/15/2011 25.2* 36.0-46.0 (%) Final  Hospital Course:  Pt presented following prelabor rupture of membranes.  Induction began for closed cx w/ cervidil.  Following little progress s/p cervidil, po cytotec started and pt received 2 doses total and early labor ensued.  Pitocin started around 2cm.  Pt received epidural in labor and GBS prophylaxis.  Prolonged rupture of membranes by time of delivery.  Median episiotomy cut after 2 hrs of pushing.  PP anemia noted PPD#1, but no s/s and not orthostatic.  Lactating and VB stable.  BP normal PP and on no meds and will have Smart Start RN check by the end of this week and weekly thereafter.  Pt to get Floridex.  No BM since Sat (today Tues); to continue colace and use Miralax prn.  PIH precautions rev'd.  Declines contraception at present.    Discharge Diagnoses: Term Pregnancy-delivered and PIH vs CHTN; Prolonged ROM; Obese; h/o fibroids; h/o abnl pap; h/o cervical polypectomy; lactating; PP anemia; Constipation IP and PP;  Discharge Information: Date: 06/16/2011 Activity: pelvic rest Diet: routine and iron rich Medications: PNV, Ibuprofen, Colace and Floridex 2 tsp bid; Miralax prn Condition: stable Instructions: refer to practice specific booklet Discharge to: home Follow-up Information    Follow up with [facility]. (call to schedule a 6 week follow-up appt, or call  as needed)          Newborn Data: Live born female  "Stevenson Clinch."  (delivery by Lavera Guise, CNM) Birth Weight: 7 lb (3175 g) APGAR: 9, 9 Inpatient Circumcision. Home with mother.  Kayon Dozier H 06/16/2011, 11:33 AM

## 2011-07-31 DIAGNOSIS — O139 Gestational [pregnancy-induced] hypertension without significant proteinuria, unspecified trimester: Secondary | ICD-10-CM

## 2011-07-31 HISTORY — DX: Gestational (pregnancy-induced) hypertension without significant proteinuria, unspecified trimester: O13.9

## 2011-09-24 ENCOUNTER — Telehealth: Payer: Self-pay

## 2011-09-24 NOTE — Telephone Encounter (Signed)
TC from pt.  Informed of need for colpo. Pt states is having  Spotting on Micronor and wants to change OCP.   Will forward message to LD to consult with Dr. Lynford Humphrey and sched colpo.

## 2011-09-29 NOTE — Telephone Encounter (Signed)
Pt notified of results and sch for colpo  ld

## 2011-10-06 ENCOUNTER — Telehealth: Payer: Self-pay | Admitting: Obstetrics and Gynecology

## 2011-10-06 NOTE — Telephone Encounter (Signed)
This pt needs a very end of the day appt due to work sch.  Can I take two ob slots at the end of any day for her? [facility]  ld

## 2011-10-08 NOTE — Telephone Encounter (Signed)
Yes

## 2011-10-12 ENCOUNTER — Telehealth: Payer: Self-pay | Admitting: Obstetrics and Gynecology

## 2011-10-14 ENCOUNTER — Encounter: Payer: Self-pay | Admitting: Obstetrics and Gynecology

## 2011-10-22 ENCOUNTER — Encounter: Payer: Self-pay | Admitting: Obstetrics and Gynecology

## 2011-10-22 ENCOUNTER — Ambulatory Visit (INDEPENDENT_AMBULATORY_CARE_PROVIDER_SITE_OTHER): Payer: 59 | Admitting: Obstetrics and Gynecology

## 2011-10-22 VITALS — BP 120/82 | Wt 226.0 lb

## 2011-10-22 DIAGNOSIS — Z9889 Other specified postprocedural states: Secondary | ICD-10-CM

## 2011-10-22 LAB — POCT URINE PREGNANCY: Preg Test, Ur: NEGATIVE

## 2011-10-22 NOTE — Progress Notes (Signed)
Colposcopy Procedure Note  Indications: Pap smear on February 2013 showed: ASCUS with POSITIVE high risk HPV. .  Procedure Details  The risks and benefits of the procedure and Written informed consent obtained.  Speculum placed in vagina and excellent visualization of cervix achieved, cervix swabbed x 3 with acetic acid solution.  Findings: Cervix: no visible lesions; SCJ visualized 360 degrees without lesions and endocervical curettage performed. Vaginal inspection: vaginal colposcopy not performed. Vulvar colposcopy: vulvar colposcopy not performed.  Specimens: ECC  Complications: none.  Plan: Specimens labelled and sent to Pathology. Will call pt with results   Silverio Lay MD

## 2011-10-22 NOTE — Patient Instructions (Signed)
Patient Education Materials to be provided at check out (*indicates is located in Garment/textile technologist):  HPV

## 2011-10-26 LAB — PATHOLOGY

## 2011-12-15 ENCOUNTER — Ambulatory Visit
Admission: RE | Admit: 2011-12-15 | Discharge: 2011-12-15 | Disposition: A | Discharge status: Home / Self Care | Payer: 59 | Source: Ambulatory Visit | Attending: Physician Assistant | Admitting: Physician Assistant

## 2011-12-15 ENCOUNTER — Other Ambulatory Visit: Payer: Self-pay | Admitting: Physician Assistant

## 2011-12-15 DIAGNOSIS — M25552 Pain in left hip: Secondary | ICD-10-CM

## 2011-12-16 ENCOUNTER — Other Ambulatory Visit: Payer: Self-pay | Admitting: Family Medicine

## 2011-12-16 DIAGNOSIS — M25552 Pain in left hip: Secondary | ICD-10-CM

## 2011-12-21 ENCOUNTER — Ambulatory Visit
Admission: RE | Admit: 2011-12-21 | Discharge: 2011-12-21 | Disposition: A | Discharge status: Home / Self Care | Payer: 59 | Source: Ambulatory Visit | Attending: Family Medicine | Admitting: Family Medicine

## 2011-12-21 DIAGNOSIS — M25552 Pain in left hip: Secondary | ICD-10-CM

## 2011-12-21 MED ORDER — GADOBENATE DIMEGLUMINE 529 MG/ML IV SOLN
20.0000 mL | Freq: Once | INTRAVENOUS | Status: AC | PRN
  Administered 2011-12-21: 20 mL via INTRAVENOUS

## 2014-04-09 ENCOUNTER — Encounter: Payer: Self-pay | Admitting: Obstetrics and Gynecology

## 2015-06-09 NOTE — L&D Delivery Note (Signed)
Delivery Note At 3:15 PM a healthy female was delivered via Vaginal, Spontaneous Delivery (Presentation: ;  ).  APGAR: , ; weight  .   Placenta status: Partial retention.  Intrauterine manual Extraction under Epidural in L+D room.  Placenta all removed.  Uterus well contracted.  No excess bleeding.  Cord:  with the following complications: None.  3 loose [place].  Cord pH: N/A  Anesthesia: Epidural Episiotomy: None Lacerations:  Paraurethral Erosion Suture Repair: 3.0 vicryl Est. Blood Loss (mL): 200  Mom to postpartum.  Baby to Couplet care / Skin to Skin.  Chloe Elliott 04/19/2016, 4:04 PM

## 2015-08-15 ENCOUNTER — Encounter: Payer: Self-pay | Admitting: Internal Medicine

## 2015-08-15 ENCOUNTER — Ambulatory Visit (INDEPENDENT_AMBULATORY_CARE_PROVIDER_SITE_OTHER): Payer: Commercial Managed Care - PPO | Admitting: Internal Medicine

## 2015-08-15 ENCOUNTER — Encounter: Payer: Self-pay | Admitting: Emergency Medicine

## 2015-08-15 VITALS — BP 144/90 | HR 78 | Temp 98.6°F | Resp 16 | Wt 219.0 lb

## 2015-08-15 DIAGNOSIS — Z0001 Encounter for general adult medical examination with abnormal findings: Secondary | ICD-10-CM | POA: Diagnosis not present

## 2015-08-15 DIAGNOSIS — E669 Obesity, unspecified: Secondary | ICD-10-CM

## 2015-08-15 DIAGNOSIS — I10 Essential (primary) hypertension: Secondary | ICD-10-CM | POA: Diagnosis not present

## 2015-08-15 DIAGNOSIS — Z0000 Encounter for general adult medical examination without abnormal findings: Secondary | ICD-10-CM

## 2015-08-15 DIAGNOSIS — E010 Iodine-deficiency related diffuse (endemic) goiter: Secondary | ICD-10-CM | POA: Insufficient documentation

## 2015-08-15 DIAGNOSIS — E049 Nontoxic goiter, unspecified: Secondary | ICD-10-CM

## 2015-08-15 MED ORDER — METHYLDOPA 250 MG PO TABS: 250.0000 mg | ORAL_TABLET | Freq: Three times a day (TID) | ORAL | Status: DC

## 2015-08-15 NOTE — Assessment & Plan Note (Signed)
Discussed weight loss medications, which are not a good option for since she is trying to get pregnant Continue exercise - as much as possible Work on decreasing portions

## 2015-08-15 NOTE — Progress Notes (Signed)
Pre visit review using our clinic review tool, if applicable. No additional management support is needed unless otherwise documented below in the visit note. 
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
## 2015-08-15 NOTE — Assessment & Plan Note (Signed)
BP elevated at home -not well controlled Increase methyldopa from 250 mg BID to TID She will monitor at home and give me an update

## 2015-08-15 NOTE — Patient Instructions (Signed)
Test(s) ordered today. Your results will be released to [place] (or called to you) after review, usually within 72hours after test completion. If any changes need to be made, you will be notified at that same time.  All other Health Maintenance issues reviewed.   All recommended immunizations and age-appropriate screenings are up-to-date.  No immunizations administered today.   Medications reviewed and updated.  Changes include increasing your  Methyldopa to 250 mg three times a day.    An ultrasound of your thyroid was ordered.   Your prescription(s) have been submitted to your pharmacy. Please take as directed and contact our office if you believe you are having problem(s) with the medication(s).  Monitor your blood pressure at home.   Please followup annually, sooner if your blood pressure is not controlled.   Health Maintenance, Female Adopting a healthy lifestyle and getting preventive care can go a long way to promote health and wellness. Talk with your health care provider about what schedule of regular examinations is right for you. This is a good chance for you to check in with your provider about disease prevention and staying healthy. In between checkups, there are plenty of things you can do on your own. Experts have done a lot of research about which lifestyle changes and preventive measures are most likely to keep you healthy. Ask your health care provider for more information. WEIGHT AND DIET  Eat a healthy diet  Be sure to include plenty of vegetables, fruits, low-fat dairy products, and lean protein.  Do not eat a lot of foods high in solid fats, added sugars, or salt.  Get regular exercise. This is one of the most important things you can do for your health.  Most adults should exercise for at least 150 minutes each week. The exercise should increase your heart rate and make you sweat (moderate-intensity exercise).  Most adults should also do strengthening  exercises at least twice a week. This is in addition to the moderate-intensity exercise.  Maintain a healthy weight  Body mass index (BMI) is a measurement that can be used to identify possible weight problems. It estimates body fat based on height and weight. Your health care provider can help determine your BMI and help you achieve or maintain a healthy weight.  For females 35 years of age and older:   A BMI below 18.5 is considered underweight.  A BMI of 18.5 to 24.9 is normal.  A BMI of 25 to 29.9 is considered overweight.  A BMI of 30 and above is considered obese.  Watch levels of cholesterol and blood lipids  You should start having your blood tested for lipids and cholesterol at 37 years of age, then have this test every 5 years.  You may need to have your cholesterol levels checked more often if:  Your lipid or cholesterol levels are high.  You are older than 37 years of age.  You are at high risk for heart disease.  CANCER SCREENING   Lung Cancer  Lung cancer screening is recommended for adults 69-76 years old who are at high risk for lung cancer because of a history of smoking.  A yearly low-dose CT scan of the lungs is recommended for people who:  Currently smoke.  Have quit within the past 15 years.  Have at least a 30-pack-year history of smoking. A pack year is smoking an average of one pack of cigarettes a day for 1 year.  Yearly screening should continue until it  has been 15 years since you quit.  Yearly screening should stop if you develop a health problem that would prevent you from having lung cancer treatment.  Breast Cancer  Practice breast self-awareness. This means understanding how your breasts normally appear and feel.  It also means doing regular breast self-exams. Let your health care provider know about any changes, no matter how small.  If you are in your 20s or 30s, you should have a clinical breast exam (CBE) by a health care  provider every 1-3 years as part of a regular health exam.  If you are 19 or older, have a CBE every year. Also consider having a breast X-ray (mammogram) every year.  If you have a family history of breast cancer, talk to your health care provider about genetic screening.  If you are at high risk for breast cancer, talk to your health care provider about having an MRI and a mammogram every year.  Breast cancer gene (BRCA) assessment is recommended for women who have family members with BRCA-related cancers. BRCA-related cancers include:  Breast.  Ovarian.  Tubal.  Peritoneal cancers.  Results of the assessment will determine the need for genetic counseling and BRCA1 and BRCA2 testing. Cervical Cancer Your health care provider may recommend that you be screened regularly for cancer of the pelvic organs (ovaries, uterus, and vagina). This screening involves a pelvic examination, including checking for microscopic changes to the surface of your cervix (Pap test). You may be encouraged to have this screening done every 3 years, beginning at age 54.  For women ages 64-65, health care providers may recommend pelvic exams and Pap testing every 3 years, or they may recommend the Pap and pelvic exam, combined with testing for human papilloma virus (HPV), every 5 years. Some types of HPV increase your risk of cervical cancer. Testing for HPV may also be done on women of any age with unclear Pap test results.  Other health care providers may not recommend any screening for nonpregnant women who are considered low risk for pelvic cancer and who do not have symptoms. Ask your health care provider if a screening pelvic exam is right for you.  If you have had past treatment for cervical cancer or a condition that could lead to cancer, you need Pap tests and screening for cancer for at least 20 years after your treatment. If Pap tests have been discontinued, your risk factors (such as having a new sexual  partner) need to be reassessed to determine if screening should resume. Some women have medical problems that increase the chance of getting cervical cancer. In these cases, your health care provider may recommend more frequent screening and Pap tests. Colorectal Cancer  This type of cancer can be detected and often prevented.  Routine colorectal cancer screening usually begins at 37 years of age and continues through 37 years of age.  Your health care provider may recommend screening at an earlier age if you have risk factors for colon cancer.  Your health care provider may also recommend using home test kits to check for hidden blood in the stool.  A small camera at the end of a tube can be used to examine your colon directly (sigmoidoscopy or colonoscopy). This is done to check for the earliest forms of colorectal cancer.  Routine screening usually begins at age 22.  Direct examination of the colon should be repeated every 5-10 years through 37 years of age. However, you may need to be screened more  often if early forms of precancerous polyps or small growths are found. Skin Cancer  Check your skin from head to toe regularly.  Tell your health care provider about any new moles or changes in moles, especially if there is a change in a mole's shape or color.  Also tell your health care provider if you have a mole that is larger than the size of a pencil eraser.  Always use sunscreen. Apply sunscreen liberally and repeatedly throughout the day.  Protect yourself by wearing long sleeves, pants, a wide-brimmed hat, and sunglasses whenever you are outside. HEART DISEASE, DIABETES, AND HIGH BLOOD PRESSURE   High blood pressure causes heart disease and increases the risk of stroke. High blood pressure is more likely to develop in:  People who have blood pressure in the high end of the normal range (130-139/85-89 mm Hg).  People who are overweight or obese.  People who are African  American.  If you are 62-54 years of age, have your blood pressure checked every 3-5 years. If you are 52 years of age or older, have your blood pressure checked every year. You should have your blood pressure measured twice--once when you are at a hospital or clinic, and once when you are not at a hospital or clinic. Record the average of the two measurements. To check your blood pressure when you are not at a hospital or clinic, you can use:  An automated blood pressure machine at a pharmacy.  A home blood pressure monitor.  If you are between 71 years and 49 years old, ask your health care provider if you should take aspirin to prevent strokes.  Have regular diabetes screenings. This involves taking a blood sample to check your fasting blood sugar level.  If you are at a normal weight and have a low risk for diabetes, have this test once every three years after 37 years of age.  If you are overweight and have a high risk for diabetes, consider being tested at a younger age or more often. PREVENTING INFECTION  Hepatitis B  If you have a higher risk for hepatitis B, you should be screened for this virus. You are considered at high risk for hepatitis B if:  You were born in a country where hepatitis B is common. Ask your health care provider which countries are considered high risk.  Your parents were born in a high-risk country, and you have not been immunized against hepatitis B (hepatitis B vaccine).  You have HIV or AIDS.  You use needles to inject street drugs.  You live with someone who has hepatitis B.  You have had sex with someone who has hepatitis B.  You get hemodialysis treatment.  You take certain medicines for conditions, including cancer, organ transplantation, and autoimmune conditions. Hepatitis C  Blood testing is recommended for:  Everyone born from [birth year] through 1967.  Anyone with known risk factors for hepatitis C. Sexually transmitted infections  (STIs)  You should be screened for sexually transmitted infections (STIs) including gonorrhea and chlamydia if:  You are sexually active and are younger than 37 years of age.  You are older than 37 years of age and your health care provider tells you that you are at risk for this type of infection.  Your sexual activity has changed since you were last screened and you are at an increased risk for chlamydia or gonorrhea. Ask your health care provider if you are at risk.  If you do not have  HIV, but are at risk, it may be recommended that you take a prescription medicine daily to prevent HIV infection. This is called pre-exposure prophylaxis (PrEP). You are considered at risk if:  You are sexually active and do not regularly use condoms or know the HIV status of your partner(s).  You take drugs by injection.  You are sexually active with a partner who has HIV. Talk with your health care provider about whether you are at high risk of being infected with HIV. If you choose to begin PrEP, you should first be tested for HIV. You should then be tested every 3 months for as long as you are taking PrEP.  PREGNANCY   If you are premenopausal and you may become pregnant, ask your health care provider about preconception counseling.  If you may become pregnant, take 400 to 800 micrograms (mcg) of folic acid every day.  If you want to prevent pregnancy, talk to your health care provider about birth control (contraception). OSTEOPOROSIS AND MENOPAUSE   Osteoporosis is a disease in which the bones lose minerals and strength with aging. This can result in serious bone fractures. Your risk for osteoporosis can be identified using a bone density scan.  If you are 86 years of age or older, or if you are at risk for osteoporosis and fractures, ask your health care provider if you should be screened.  Ask your health care provider whether you should take a calcium or vitamin D supplement to lower your risk  for osteoporosis.  Menopause may have certain physical symptoms and risks.  Hormone replacement therapy may reduce some of these symptoms and risks. Talk to your health care provider about whether hormone replacement therapy is right for you.  HOME CARE INSTRUCTIONS   Schedule regular health, dental, and eye exams.  Stay current with your immunizations.   Do not use any tobacco products including cigarettes, chewing tobacco, or electronic cigarettes.  If you are pregnant, do not drink alcohol.  If you are breastfeeding, limit how much and how often you drink alcohol.  Limit alcohol intake to no more than 1 drink per day for nonpregnant women. One drink equals 12 ounces of beer, 5 ounces of wine, or 1 ounces of hard liquor.  Do not use street drugs.  Do not share needles.  Ask your health care provider for help if you need support or information about quitting drugs.  Tell your health care provider if you often feel depressed.  Tell your health care provider if you have ever been abused or do not feel safe at home.   This information is not intended to replace advice given to you by your health care provider. Make sure you discuss any questions you have with your health care provider.   Document Released: 12/08/2010 Document Revised: 06/15/2014 Document Reviewed: 04/26/2013 Elsevier Interactive Patient Education [employer].

## 2015-08-15 NOTE — Assessment & Plan Note (Signed)
Right side only Check [place] Check tsh, ft4

## 2015-08-20 ENCOUNTER — Other Ambulatory Visit (INDEPENDENT_AMBULATORY_CARE_PROVIDER_SITE_OTHER): Payer: Commercial Managed Care - PPO

## 2015-08-20 DIAGNOSIS — Z0000 Encounter for general adult medical examination without abnormal findings: Secondary | ICD-10-CM

## 2015-08-20 LAB — CBC WITH DIFFERENTIAL/PLATELET
BASOS ABS: 0 10*3/uL (ref 0.0–0.1)
Basophils Relative: 0.7 % (ref 0.0–3.0)
EOS ABS: 0.1 10*3/uL (ref 0.0–0.7)
Eosinophils Relative: 3.8 % (ref 0.0–5.0)
HCT: 36.9 % (ref 36.0–46.0)
Hemoglobin: 12.4 g/dL (ref 12.0–15.0)
LYMPHS ABS: 1.6 10*3/uL (ref 0.7–4.0)
Lymphocytes Relative: 40.7 % (ref 12.0–46.0)
MCHC: 33.7 g/dL (ref 30.0–36.0)
MCV: 91.8 fl (ref 78.0–100.0)
MONO ABS: 0.5 10*3/uL (ref 0.1–1.0)
Monocytes Relative: 12.5 % — ABNORMAL HIGH (ref 3.0–12.0)
NEUTROS ABS: 1.6 10*3/uL (ref 1.4–7.7)
NEUTROS PCT: 42.3 % — AB (ref 43.0–77.0)
PLATELETS: 304 10*3/uL (ref 150.0–400.0)
RBC: 4.02 Mil/uL (ref 3.87–5.11)
RDW: 12.3 % (ref 11.5–15.5)
WBC: 3.8 10*3/uL — ABNORMAL LOW (ref 4.0–10.5)

## 2015-08-20 LAB — LIPID PANEL
CHOLESTEROL: 140 mg/dL (ref 0–200)
HDL: 60.8 mg/dL (ref >39.00)
LDL Cholesterol: 69 mg/dL (ref 0–99)
NONHDL: 79.17
Total CHOL/HDL Ratio: 2
Triglycerides: 49 mg/dL (ref 0.0–149.0)
VLDL: 9.8 mg/dL (ref 0.0–40.0)

## 2015-08-20 LAB — COMPREHENSIVE METABOLIC PANEL
ALK PHOS: 69 U/L (ref 39–117)
ALT: 14 U/L (ref 0–35)
AST: 17 U/L (ref 0–37)
Albumin: 4 g/dL (ref 3.5–5.2)
BUN: 9 mg/dL (ref 6–23)
CO2: 28 meq/L (ref 19–32)
Calcium: 9.2 mg/dL (ref 8.4–10.5)
Chloride: 103 mEq/L (ref 96–112)
Creatinine, Ser: 0.72 mg/dL (ref 0.40–1.20)
GFR: 117.26 mL/min (ref >60.00)
Glucose, Bld: 80 mg/dL (ref 70–99)
Potassium: 3.4 mEq/L — ABNORMAL LOW (ref 3.5–5.1)
SODIUM: 139 meq/L (ref 135–145)
TOTAL PROTEIN: 7.5 g/dL (ref 6.0–8.3)
Total Bilirubin: 0.6 mg/dL (ref 0.2–1.2)

## 2015-08-20 LAB — T4, FREE: FREE T4: 0.88 ng/dL (ref 0.60–1.60)

## 2015-08-20 LAB — VITAMIN B12: Vitamin B-12: 598 pg/mL (ref 211–911)

## 2015-08-20 LAB — TSH: TSH: 0.52 u[IU]/mL (ref 0.35–4.50)

## 2015-08-20 LAB — HEMOGLOBIN A1C: HEMOGLOBIN A1C: 5.4 % (ref 4.6–6.5)

## 2015-08-23 LAB — VITAMIN D 1,25 DIHYDROXY
Vitamin D 1, 25 (OH)2 Total: 57 pg/mL (ref 18–72)
Vitamin D3 1, 25 (OH)2: 57 pg/mL

## 2015-09-16 ENCOUNTER — Telehealth: Payer: Self-pay | Admitting: Internal Medicine

## 2015-09-16 NOTE — Telephone Encounter (Signed)
Patient faxed physical exam form over a couple months ago to be completed.  She would like to know if this was received and completed.

## 2015-09-16 NOTE — Telephone Encounter (Signed)
Spoke with pt, She will be refaxing form over.

## 2015-09-17 NOTE — Telephone Encounter (Signed)
Form has been faxed back to pt.

## 2015-09-30 ENCOUNTER — Encounter: Payer: Self-pay | Admitting: Internal Medicine

## 2015-10-16 ENCOUNTER — Ambulatory Visit: Payer: Self-pay | Admitting: Internal Medicine

## 2016-03-05 ENCOUNTER — Other Ambulatory Visit (HOSPITAL_COMMUNITY): Payer: Self-pay | Admitting: Obstetrics and Gynecology

## 2016-03-05 DIAGNOSIS — O09523 Supervision of elderly multigravida, third trimester: Secondary | ICD-10-CM

## 2016-03-05 DIAGNOSIS — Z3A29 29 weeks gestation of pregnancy: Secondary | ICD-10-CM

## 2016-03-05 DIAGNOSIS — O10913 Unspecified pre-existing hypertension complicating pregnancy, third trimester: Secondary | ICD-10-CM

## 2016-03-05 DIAGNOSIS — O10919 Unspecified pre-existing hypertension complicating pregnancy, unspecified trimester: Secondary | ICD-10-CM

## 2016-03-06 ENCOUNTER — Ambulatory Visit (HOSPITAL_COMMUNITY)
Admission: RE | Admit: 2016-03-06 | Discharge: 2016-03-06 | Disposition: A | Discharge status: Home / Self Care | Payer: Commercial Managed Care - PPO | Source: Ambulatory Visit | Attending: Obstetrics and Gynecology | Admitting: Obstetrics and Gynecology

## 2016-03-06 DIAGNOSIS — O09523 Supervision of elderly multigravida, third trimester: Secondary | ICD-10-CM | POA: Diagnosis not present

## 2016-03-06 DIAGNOSIS — O10913 Unspecified pre-existing hypertension complicating pregnancy, third trimester: Secondary | ICD-10-CM | POA: Insufficient documentation

## 2016-03-06 DIAGNOSIS — E669 Obesity, unspecified: Secondary | ICD-10-CM | POA: Insufficient documentation

## 2016-03-06 DIAGNOSIS — O99213 Obesity complicating pregnancy, third trimester: Secondary | ICD-10-CM | POA: Insufficient documentation

## 2016-03-06 DIAGNOSIS — Z3A30 30 weeks gestation of pregnancy: Secondary | ICD-10-CM | POA: Insufficient documentation

## 2016-03-13 ENCOUNTER — Ambulatory Visit (HOSPITAL_COMMUNITY)
Admission: RE | Admit: 2016-03-13 | Discharge: 2016-03-13 | Disposition: A | Discharge status: Home / Self Care | Payer: Commercial Managed Care - PPO | Source: Ambulatory Visit | Attending: Obstetrics and Gynecology | Admitting: Obstetrics and Gynecology

## 2016-03-13 ENCOUNTER — Other Ambulatory Visit (HOSPITAL_COMMUNITY): Payer: Self-pay | Admitting: Obstetrics and Gynecology

## 2016-03-13 DIAGNOSIS — Z3A31 31 weeks gestation of pregnancy: Secondary | ICD-10-CM

## 2016-03-13 DIAGNOSIS — O09523 Supervision of elderly multigravida, third trimester: Secondary | ICD-10-CM | POA: Diagnosis not present

## 2016-03-13 DIAGNOSIS — O10013 Pre-existing essential hypertension complicating pregnancy, third trimester: Secondary | ICD-10-CM | POA: Insufficient documentation

## 2016-03-13 DIAGNOSIS — O99213 Obesity complicating pregnancy, third trimester: Secondary | ICD-10-CM | POA: Diagnosis not present

## 2016-03-13 DIAGNOSIS — O10919 Unspecified pre-existing hypertension complicating pregnancy, unspecified trimester: Secondary | ICD-10-CM

## 2016-03-13 DIAGNOSIS — Z3A29 29 weeks gestation of pregnancy: Secondary | ICD-10-CM

## 2016-04-15 ENCOUNTER — Encounter (HOSPITAL_COMMUNITY): Payer: Self-pay | Admitting: *Deleted

## 2016-04-15 ENCOUNTER — Inpatient Hospital Stay (HOSPITAL_COMMUNITY)
Admission: AD | Admit: 2016-04-15 | Discharge: 2016-04-15 | Disposition: A | Discharge status: Home / Self Care | Payer: Commercial Managed Care - PPO | Source: Ambulatory Visit | Attending: Obstetrics and Gynecology | Admitting: Obstetrics and Gynecology

## 2016-04-15 DIAGNOSIS — Z3A35 35 weeks gestation of pregnancy: Secondary | ICD-10-CM | POA: Diagnosis not present

## 2016-04-15 DIAGNOSIS — O10013 Pre-existing essential hypertension complicating pregnancy, third trimester: Secondary | ICD-10-CM | POA: Insufficient documentation

## 2016-04-15 LAB — PROTEIN / CREATININE RATIO, URINE
Creatinine, Urine: 90 mg/dL
PROTEIN CREATININE RATIO: 0.14 mg/mg{creat} (ref 0.00–0.15)
Total Protein, Urine: 13 mg/dL

## 2016-04-15 LAB — COMPREHENSIVE METABOLIC PANEL
ALBUMIN: 3.1 g/dL — AB (ref 3.5–5.0)
ALK PHOS: 80 U/L (ref 38–126)
ALT: 14 U/L (ref 14–54)
AST: 19 U/L (ref 15–41)
Anion gap: 7 (ref 5–15)
BUN: 8 mg/dL (ref 6–20)
CALCIUM: 9.5 mg/dL (ref 8.9–10.3)
CHLORIDE: 105 mmol/L (ref 101–111)
CO2: 25 mmol/L (ref 22–32)
CREATININE: 0.69 mg/dL (ref 0.44–1.00)
GFR calc Af Amer: >60 mL/min (ref >60)
GFR calc non Af Amer: >60 mL/min (ref >60)
GLUCOSE: 61 mg/dL — AB (ref 65–99)
Potassium: 3.9 mmol/L (ref 3.5–5.1)
SODIUM: 137 mmol/L (ref 135–145)
Total Bilirubin: 0.4 mg/dL (ref 0.3–1.2)
Total Protein: 6.6 g/dL (ref 6.5–8.1)

## 2016-04-15 LAB — OB RESULTS CONSOLE GBS: STREP GROUP B AG: POSITIVE

## 2016-04-15 LAB — URIC ACID: Uric Acid, Serum: 3.9 mg/dL (ref 2.3–6.6)

## 2016-04-15 NOTE — Discharge Instructions (Signed)

## 2016-04-15 NOTE — MAU Note (Signed)
Pt sent from MD office for elevated BP, denies HA or blurry vision.  "I feel fine."  Denies contractions, bleeding or LOF.
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
## 2016-04-19 ENCOUNTER — Inpatient Hospital Stay (HOSPITAL_COMMUNITY)
Admission: AD | Admit: 2016-04-19 | Discharge: 2016-04-21 | DRG: 767 | Disposition: A | Discharge status: Home / Self Care | Payer: Commercial Managed Care - PPO | Source: Ambulatory Visit | Attending: Obstetrics & Gynecology | Admitting: Obstetrics & Gynecology

## 2016-04-19 ENCOUNTER — Inpatient Hospital Stay (HOSPITAL_COMMUNITY): Payer: Commercial Managed Care - PPO | Admitting: Anesthesiology

## 2016-04-19 ENCOUNTER — Encounter (HOSPITAL_COMMUNITY): Payer: Self-pay

## 2016-04-19 DIAGNOSIS — Z6841 Body Mass Index (BMI) 40.0 and over, adult: Secondary | ICD-10-CM | POA: Diagnosis not present

## 2016-04-19 DIAGNOSIS — Z823 Family history of stroke: Secondary | ICD-10-CM | POA: Diagnosis not present

## 2016-04-19 DIAGNOSIS — I10 Essential (primary) hypertension: Secondary | ICD-10-CM | POA: Diagnosis present

## 2016-04-19 DIAGNOSIS — O99824 Streptococcus B carrier state complicating childbirth: Secondary | ICD-10-CM | POA: Diagnosis present

## 2016-04-19 DIAGNOSIS — O4290 Premature rupture of membranes, unspecified as to length of time between rupture and onset of labor, unspecified weeks of gestation: Secondary | ICD-10-CM | POA: Diagnosis present

## 2016-04-19 DIAGNOSIS — Z3A36 36 weeks gestation of pregnancy: Secondary | ICD-10-CM

## 2016-04-19 DIAGNOSIS — O1002 Pre-existing essential hypertension complicating childbirth: Secondary | ICD-10-CM | POA: Diagnosis present

## 2016-04-19 DIAGNOSIS — Z833 Family history of diabetes mellitus: Secondary | ICD-10-CM | POA: Diagnosis not present

## 2016-04-19 DIAGNOSIS — Z8249 Family history of ischemic heart disease and other diseases of the circulatory system: Secondary | ICD-10-CM

## 2016-04-19 DIAGNOSIS — O99214 Obesity complicating childbirth: Secondary | ICD-10-CM | POA: Diagnosis present

## 2016-04-19 DIAGNOSIS — O42913 Preterm premature rupture of membranes, unspecified as to length of time between rupture and onset of labor, third trimester: Principal | ICD-10-CM | POA: Diagnosis present

## 2016-04-19 LAB — COMPREHENSIVE METABOLIC PANEL
ALBUMIN: 3.3 g/dL — AB (ref 3.5–5.0)
ALT: 14 U/L (ref 14–54)
ANION GAP: 8 (ref 5–15)
AST: 47 U/L — ABNORMAL HIGH (ref 15–41)
Alkaline Phosphatase: 76 U/L (ref 38–126)
BUN: 9 mg/dL (ref 6–20)
CHLORIDE: 105 mmol/L (ref 101–111)
CO2: 22 mmol/L (ref 22–32)
Calcium: 9 mg/dL (ref 8.9–10.3)
Creatinine, Ser: 0.69 mg/dL (ref 0.44–1.00)
GFR calc Af Amer: >60 mL/min (ref >60)
GFR calc non Af Amer: >60 mL/min (ref >60)
GLUCOSE: 86 mg/dL (ref 65–99)
POTASSIUM: 5.5 mmol/L — AB (ref 3.5–5.1)
SODIUM: 135 mmol/L (ref 135–145)
TOTAL PROTEIN: 6.8 g/dL (ref 6.5–8.1)
Total Bilirubin: 1.7 mg/dL — ABNORMAL HIGH (ref 0.3–1.2)

## 2016-04-19 LAB — TYPE AND SCREEN
ABO/RH(D): A POS
ANTIBODY SCREEN: NEGATIVE

## 2016-04-19 LAB — URINALYSIS, ROUTINE W REFLEX MICROSCOPIC
Bilirubin Urine: NEGATIVE
Glucose, UA: NEGATIVE mg/dL
Ketones, ur: NEGATIVE mg/dL
LEUKOCYTES UA: NEGATIVE
Nitrite: NEGATIVE
PROTEIN: NEGATIVE mg/dL
Specific Gravity, Urine: 1.01 (ref 1.005–1.030)
pH: 7 (ref 5.0–8.0)

## 2016-04-19 LAB — CBC
HEMATOCRIT: 33.8 % — AB (ref 36.0–46.0)
HEMATOCRIT: 33.8 % — AB (ref 36.0–46.0)
HEMOGLOBIN: 12 g/dL (ref 12.0–15.0)
HEMOGLOBIN: 11.7 g/dL — AB (ref 12.0–15.0)
MCH: 32.2 pg (ref 26.0–34.0)
MCH: 31.8 pg (ref 26.0–34.0)
MCHC: 35.5 g/dL (ref 30.0–36.0)
MCHC: 34.6 g/dL (ref 30.0–36.0)
MCV: 90.6 fL (ref 78.0–100.0)
MCV: 91.8 fL (ref 78.0–100.0)
Platelets: 271 10*3/uL (ref 150–400)
Platelets: 263 10*3/uL (ref 150–400)
RBC: 3.73 MIL/uL — ABNORMAL LOW (ref 3.87–5.11)
RBC: 3.68 MIL/uL — AB (ref 3.87–5.11)
RDW: 12.5 % (ref 11.5–15.5)
RDW: 12.7 % (ref 11.5–15.5)
WBC: 8 10*3/uL (ref 4.0–10.5)
WBC: 12.9 10*3/uL — AB (ref 4.0–10.5)

## 2016-04-19 LAB — RPR: RPR Ser Ql: NONREACTIVE

## 2016-04-19 LAB — URINE MICROSCOPIC-ADD ON
Bacteria, UA: NONE SEEN
RBC / HPF: NONE SEEN RBC/hpf (ref 0–5)
WBC, UA: NONE SEEN WBC/hpf (ref 0–5)

## 2016-04-19 LAB — POCT FERN TEST: POCT FERN TEST: POSITIVE

## 2016-04-19 LAB — PROTEIN / CREATININE RATIO, URINE: CREATININE, URINE: 31 mg/dL

## 2016-04-19 MED ORDER — SODIUM BICARBONATE 8.4 % IV SOLN
INTRAVENOUS | Status: DC | PRN
  Administered 2016-04-19: 5 mL via EPIDURAL

## 2016-04-19 MED ORDER — METHYLDOPA 500 MG PO TABS
500.0000 mg | ORAL_TABLET | Freq: Three times a day (TID) | ORAL | Status: DC
  Administered 2016-04-19: 500 mg via ORAL
  Filled 2016-04-19 (×4): qty 1

## 2016-04-19 MED ORDER — PHENYLEPHRINE 40 MCG/ML (10ML) SYRINGE FOR IV PUSH (FOR BLOOD PRESSURE SUPPORT)
80.0000 ug | PREFILLED_SYRINGE | INTRAVENOUS | Status: DC | PRN
  Filled 2016-04-19: qty 5

## 2016-04-19 MED ORDER — LABETALOL HCL 100 MG PO TABS
100.0000 mg | ORAL_TABLET | Freq: Three times a day (TID) | ORAL | Status: DC
  Administered 2016-04-19 – 2016-04-21 (×5): 100 mg via ORAL
  Filled 2016-04-19 (×5): qty 1

## 2016-04-19 MED ORDER — PHENYLEPHRINE 40 MCG/ML (10ML) SYRINGE FOR IV PUSH (FOR BLOOD PRESSURE SUPPORT)
80.0000 ug | PREFILLED_SYRINGE | INTRAVENOUS | Status: DC | PRN
  Filled 2016-04-19: qty 5
  Filled 2016-04-19: qty 10

## 2016-04-19 MED ORDER — LACTATED RINGERS IV SOLN
500.0000 mL | Freq: Once | INTRAVENOUS | Status: AC
  Administered 2016-04-19: 500 mL via INTRAVENOUS

## 2016-04-19 MED ORDER — OXYTOCIN 40 UNITS IN LACTATED RINGERS INFUSION - SIMPLE MED: 2.5000 [IU]/h | INTRAVENOUS | Status: DC

## 2016-04-19 MED ORDER — PENICILLIN G POTASSIUM 5000000 UNITS IJ SOLR
5.0000 10*6.[IU] | Freq: Once | INTRAVENOUS | Status: AC
  Administered 2016-04-19: 5 10*6.[IU] via INTRAVENOUS
  Filled 2016-04-19: qty 5

## 2016-04-19 MED ORDER — IBUPROFEN 600 MG PO TABS
600.0000 mg | ORAL_TABLET | Freq: Four times a day (QID) | ORAL | Status: DC
  Administered 2016-04-19 – 2016-04-21 (×6): 600 mg via ORAL
  Filled 2016-04-19 (×6): qty 1

## 2016-04-19 MED ORDER — DIPHENHYDRAMINE HCL 25 MG PO CAPS: 25.0000 mg | ORAL_CAPSULE | Freq: Four times a day (QID) | ORAL | Status: DC | PRN

## 2016-04-19 MED ORDER — COCONUT OIL OIL: 1.0000 "application " | TOPICAL_OIL | Status: DC | PRN

## 2016-04-19 MED ORDER — ACETAMINOPHEN 325 MG PO TABS: 650.0000 mg | ORAL_TABLET | ORAL | Status: DC | PRN

## 2016-04-19 MED ORDER — FENTANYL 2.5 MCG/ML BUPIVACAINE 1/10 % EPIDURAL INFUSION (WH - ANES)
14.0000 mL/h | INTRAMUSCULAR | Status: DC | PRN
  Administered 2016-04-19: 14 mL/h via EPIDURAL
  Filled 2016-04-19: qty 100

## 2016-04-19 MED ORDER — LACTATED RINGERS IV SOLN: 500.0000 mL | INTRAVENOUS | Status: DC | PRN

## 2016-04-19 MED ORDER — SIMETHICONE 80 MG PO CHEW: 80.0000 mg | CHEWABLE_TABLET | ORAL | Status: DC | PRN

## 2016-04-19 MED ORDER — PRENATAL MULTIVITAMIN CH
1.0000 | ORAL_TABLET | Freq: Every day | ORAL | Status: DC
  Administered 2016-04-20: 1 via ORAL
  Filled 2016-04-19: qty 1

## 2016-04-19 MED ORDER — OXYCODONE HCL 5 MG PO TABS: 5.0000 mg | ORAL_TABLET | ORAL | Status: DC | PRN

## 2016-04-19 MED ORDER — OXYTOCIN 40 UNITS IN LACTATED RINGERS INFUSION - SIMPLE MED: 2.5000 [IU]/h | INTRAVENOUS | Status: DC | PRN

## 2016-04-19 MED ORDER — SOD CITRATE-CITRIC ACID 500-334 MG/5ML PO SOLN: 30.0000 mL | ORAL | Status: DC | PRN

## 2016-04-19 MED ORDER — FLEET ENEMA 7-19 GM/118ML RE ENEM: 1.0000 | ENEMA | RECTAL | Status: DC | PRN

## 2016-04-19 MED ORDER — METHYLDOPA 500 MG PO TABS
500.0000 mg | ORAL_TABLET | Freq: Three times a day (TID) | ORAL | Status: DC
  Administered 2016-04-19 – 2016-04-21 (×5): 500 mg via ORAL
  Filled 2016-04-19 (×5): qty 1

## 2016-04-19 MED ORDER — BENZOCAINE-MENTHOL 20-0.5 % EX AERO: 1.0000 "application " | INHALATION_SPRAY | CUTANEOUS | Status: DC | PRN

## 2016-04-19 MED ORDER — OXYCODONE-ACETAMINOPHEN 5-325 MG PO TABS: 2.0000 | ORAL_TABLET | ORAL | Status: DC | PRN

## 2016-04-19 MED ORDER — PENICILLIN G POT IN DEXTROSE 60000 UNIT/ML IV SOLN
3.0000 10*6.[IU] | INTRAVENOUS | Status: DC
  Administered 2016-04-19: 3 10*6.[IU] via INTRAVENOUS
  Filled 2016-04-19 (×4): qty 50

## 2016-04-19 MED ORDER — SENNOSIDES-DOCUSATE SODIUM 8.6-50 MG PO TABS
2.0000 | ORAL_TABLET | ORAL | Status: DC
  Administered 2016-04-19 – 2016-04-20 (×2): 2 via ORAL
  Filled 2016-04-19 (×2): qty 2

## 2016-04-19 MED ORDER — EPHEDRINE 5 MG/ML INJ
10.0000 mg | INTRAVENOUS | Status: DC | PRN
  Filled 2016-04-19: qty 4

## 2016-04-19 MED ORDER — BUPIVACAINE HCL (PF) 0.25 % IJ SOLN
INTRAMUSCULAR | Status: DC | PRN
  Administered 2016-04-19: 5 mL via EPIDURAL

## 2016-04-19 MED ORDER — LIDOCAINE HCL (PF) 1 % IJ SOLN
INTRAMUSCULAR | Status: DC | PRN
  Administered 2016-04-19: 4 mL
  Administered 2016-04-19: 6 mL via EPIDURAL

## 2016-04-19 MED ORDER — ZOLPIDEM TARTRATE 5 MG PO TABS: 5.0000 mg | ORAL_TABLET | Freq: Every evening | ORAL | Status: DC | PRN

## 2016-04-19 MED ORDER — TETANUS-DIPHTH-ACELL PERTUSSIS 5-2.5-18.5 LF-MCG/0.5 IM SUSP: 0.5000 mL | Freq: Once | INTRAMUSCULAR | Status: DC

## 2016-04-19 MED ORDER — ONDANSETRON HCL 4 MG PO TABS: 4.0000 mg | ORAL_TABLET | ORAL | Status: DC | PRN

## 2016-04-19 MED ORDER — TERBUTALINE SULFATE 1 MG/ML IJ SOLN
0.2500 mg | Freq: Once | INTRAMUSCULAR | Status: DC | PRN
  Filled 2016-04-19: qty 1

## 2016-04-19 MED ORDER — LIDOCAINE HCL (PF) 1 % IJ SOLN
30.0000 mL | INTRAMUSCULAR | Status: DC | PRN
  Filled 2016-04-19: qty 30

## 2016-04-19 MED ORDER — OXYTOCIN 40 UNITS IN LACTATED RINGERS INFUSION - SIMPLE MED
1.0000 m[IU]/min | INTRAVENOUS | Status: DC
  Administered 2016-04-19: 2 m[IU]/min via INTRAVENOUS
  Filled 2016-04-19: qty 1000

## 2016-04-19 MED ORDER — LACTATED RINGERS IV SOLN
INTRAVENOUS | Status: DC
  Administered 2016-04-19 (×2): via INTRAVENOUS

## 2016-04-19 MED ORDER — OXYTOCIN BOLUS FROM INFUSION: 500.0000 mL | Freq: Once | INTRAVENOUS | Status: DC

## 2016-04-19 MED ORDER — WITCH HAZEL-GLYCERIN EX PADS: 1.0000 "application " | MEDICATED_PAD | CUTANEOUS | Status: DC | PRN

## 2016-04-19 MED ORDER — OXYCODONE HCL 5 MG PO TABS: 10.0000 mg | ORAL_TABLET | ORAL | Status: DC | PRN

## 2016-04-19 MED ORDER — ONDANSETRON HCL 4 MG/2ML IJ SOLN: 4.0000 mg | INTRAMUSCULAR | Status: DC | PRN

## 2016-04-19 MED ORDER — OXYCODONE-ACETAMINOPHEN 5-325 MG PO TABS: 1.0000 | ORAL_TABLET | ORAL | Status: DC | PRN

## 2016-04-19 MED ORDER — ONDANSETRON HCL 4 MG/2ML IJ SOLN: 4.0000 mg | Freq: Four times a day (QID) | INTRAMUSCULAR | Status: DC | PRN

## 2016-04-19 MED ORDER — DIBUCAINE 1 % RE OINT: 1.0000 "application " | TOPICAL_OINTMENT | RECTAL | Status: DC | PRN

## 2016-04-19 MED ORDER — DIPHENHYDRAMINE HCL 50 MG/ML IJ SOLN: 12.5000 mg | INTRAMUSCULAR | Status: DC | PRN

## 2016-04-19 MED ORDER — ACETAMINOPHEN 325 MG PO TABS
650.0000 mg | ORAL_TABLET | ORAL | Status: DC | PRN
  Administered 2016-04-19 – 2016-04-20 (×2): 650 mg via ORAL
  Filled 2016-04-19 (×2): qty 2

## 2016-04-19 NOTE — Anesthesia Procedure Notes (Signed)

## 2016-04-19 NOTE — Progress Notes (Signed)
Called about pt in MAU.  No answer. Will call back

## 2016-04-19 NOTE — Lactation Note (Signed)
This note was copied from a baby's chart. Lactation Consultation Note  Patient Name: Chloe Elliott [id number] Date: 04/19/2016 Reason for consult: Initial assessment;Late preterm infant   Called to room due to LPT infant and CBG of 38. Infant 36w 2d GA weighing 6 lb 2.9 oz. Infant also noted to be hypothermic. Infant was placed back STS when mom got settled into bed. Mom reports she had difficulty BF her son who was 37 weeks and had to supplement. Mom reports she felt a lot of pressure when BF her son and does not wish to be pressured to BF this infant.. Discussed with mom to let the nurses and [place] know if she needs to revamp the plan to meet her and her infant needs.   Attempted to hand express mom on both breasts, minute gtts colostrum noted. Mom with large breasts with lumpy feel to breast tissue. Breasts are compressible, areola area is semi compressible. Nipples are semi flat and flatten more with compression. Mom reported moderate tenderness with breast compression.   LPT infant policy Handout given and reviewed with parents. Reviewed need for decreased stimulation between feeds and limiting feeding and supplementing time to 30 minutes. Reviewed that infant should be fed with feeding cues with no longer than 3 hours between feeds. Enc parents to keep infant STS as much as possible.   DEBP set up, mom was shown how to use Initiate setting. Enc mom to pump every 3 hours post BF with DEBP on Initiate setting for 15 minutes. Mom discussed she was ok with infant getting formula until her milk comes in and would prefer that she does get formula at this time. Mom was shown how to assemble, disassemble and clean pump parts. Mom pumped with her son so she was familiar with the pump use.   Attempted to BF infant. She opened wide and attempted to latch to left breast in the cross cradle hold. She latched a few times and then fell asleep. She was noted to have cheek dimpling the entire time she attempted to  latch. Suspected to have a nipple to infant mouth fit issue as areola are thick and not fully compressible. Caryl Pina, RN came to bedside and brought the formula, she finger/syringe fed infant 7 cc Alimentum while infant STS with mom. Infant fed very well and tolerated the feeding well. Infant left STS with mom after feeding.   BF Resources Handout and [employer] Brochure given, mom informed of IP/OP Services, BF Support Groups and [place] phone #. Enc mom to call out to desk for feeding assistance as needed. Follow up tomorrow and prn.  Plan made with parents and written on board in room: BF infant at breast at first feeding cues with no longer than 3 hours between feedings, attempt no longer than 15-20 minutes Supplement infant with EBM or Alimentum with syringe/finger feeding, amounts per LPT infant policy Pump with DEBP for 15 minutes on Initiate setting Hand express both breasts Call out to desk for feeding assistance as needed Follow up tomorrow and prn    Maternal Data Formula Feeding for Exclusion: No Has patient been taught Hand Expression?: Yes Does the patient have breastfeeding experience prior to this delivery?: Yes  Feeding Feeding Type: Formula Length of feed: 0 min  LATCH Score/Interventions Latch: Repeated attempts needed to sustain latch, nipple held in mouth throughout feeding, stimulation needed to elicit sucking reflex. Intervention(s): Adjust position;Assist with latch;Breast massage;Breast compression  Audible Swallowing: None Intervention(s): Skin to skin Intervention(s): Alternate breast  massage;Hand expression;Skin to skin  Type of Nipple: Flat (flattens with compression, thick areola with compression) Intervention(s): Double electric pump  Comfort (Breast/Nipple): Filling, red/small blisters or bruises, mild/mod discomfort  Problem noted: Mild/Moderate discomfort (with hand expression)  Hold (Positioning): Assistance needed to correctly position infant at breast and  maintain latch. Intervention(s): Breastfeeding basics reviewed;Support Pillows;Position options;Skin to skin  LATCH Score: 4  Lactation Tools Discussed/Used WIC Program: No Pump Review: Setup, frequency, and cleaning;Milk Storage Initiated by:: Nonah Mattes, RN, IBCLC Date initiated:: 04/19/16   Consult Status Consult Status: Follow-up Date: 04/20/16 Follow-up type: In-patient    Debby Freiberg Lisseth Brazeau 04/19/2016, 6:51 PM

## 2016-04-19 NOTE — Anesthesia Preprocedure Evaluation (Signed)
Anesthesia Evaluation  Patient identified by MRN, date of birth, ID band Patient awake    Reviewed: Allergy & Precautions, H&P , Patient's Chart, lab work & pertinent test results  Airway Mallampati: III  TM Distance: >3 FB Neck ROM: full    Dental  (+) Teeth Intact   Pulmonary    breath sounds clear to auscultation       Cardiovascular hypertension,  Rhythm:regular Rate:Normal     Neuro/Psych    GI/Hepatic   Endo/Other  Morbid obesity  Renal/GU      Musculoskeletal   Abdominal   Peds  Hematology   Anesthesia Other Findings   PIH MO    Reproductive/Obstetrics (+) Pregnancy                             Anesthesia Physical Anesthesia Plan  ASA: III  Anesthesia Plan: Epidural   Post-op Pain Management:    Induction:   Airway Management Planned:   Additional Equipment:   Intra-op Plan:   Post-operative Plan:   Informed Consent: I have reviewed the patients History and Physical, chart, labs and discussed the procedure including the risks, benefits and alternatives for the proposed anesthesia with the patient or authorized representative who has indicated his/her understanding and acceptance.   Dental Advisory Given  Plan Discussed with:   Anesthesia Plan Comments: (Labs checked- platelets confirmed with RN in room. Fetal heart tracing, per RN, reported to be stable enough for sitting procedure. Discussed epidural, and patient consents to the procedure:  included risk of possible headache,backache, failed block, allergic reaction, and nerve injury. This patient was asked if she had any questions or concerns before the procedure started.)        Anesthesia Quick Evaluation

## 2016-04-19 NOTE — Progress Notes (Signed)
Subjective: Doing well, pain controled, UCs q2-3 min  Anesthesia epidural PROM on Pitocin Induction CHTN on Aldomet   Objective: BP (!) 166/91   Pulse 77   Temp 98.1 F (36.7 C) (Axillary)   Resp 18   Ht 5\' 9"  (1.753 m)   Wt 273 lb 3.2 oz (123.9 kg)   BMI 40.34 kg/m    FHT:  130-140/min with good variability and accelerations.  Prolonged deceleration post epidural.  Recovered.  Occasional mild-moderate variable decelerations and early decelerations. UC:   regular, every 2-3 minutes VE:   4/90%/Vtx/-2.  AF clear  PIH labs Plts 271 wnl, ALT wnl, AST mild increase at 47   Assessment / Plan: Induction of labor due to PROM,  progressing well on pitocin  Fetal Wellbeing:  Category I Pain Control:  Epidural  Anticipated MOD:  NSVD  Chloe Elliott 04/19/2016, 2:43 PM

## 2016-04-19 NOTE — MAU Note (Signed)
Leaking clear fld since 0430. Sl cramping. Last Weds cervix closed. Has HTN and was here 4 days ago for labs which were ok.

## 2016-04-19 NOTE — Progress Notes (Signed)
Patient called RN to room stating she was dizzy & light-headed, and felt like she was going to pass out.  Covers removed, cool compress applied, Room temp decreased.  Bleeding & vitals wnl.  Patient's symptoms resolved, bedside commode offered, patient toileted, and put back to bed.  Patient thinks she "got too hot."  Will continue to monitor.
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
## 2016-04-19 NOTE — Progress Notes (Signed)
Call to Dr Dellis Filbert regarding drop in blood pressure following delivery to [facility] levels and whether to administer labadalol, no response, message left requesting call back

## 2016-04-19 NOTE — Progress Notes (Signed)
Notified of pt arrival in MAU and exam with positive fern. Also of pt elevated BP. Will admit to labor and delivery

## 2016-04-20 ENCOUNTER — Encounter (HOSPITAL_COMMUNITY): Payer: Self-pay

## 2016-04-20 LAB — COMPREHENSIVE METABOLIC PANEL
ALBUMIN: 2.7 g/dL — AB (ref 3.5–5.0)
ALT: 12 U/L — ABNORMAL LOW (ref 14–54)
ANION GAP: 6 (ref 5–15)
AST: 19 U/L (ref 15–41)
Alkaline Phosphatase: 65 U/L (ref 38–126)
BILIRUBIN TOTAL: 0.5 mg/dL (ref 0.3–1.2)
BUN: 14 mg/dL (ref 6–20)
CHLORIDE: 103 mmol/L (ref 101–111)
CO2: 23 mmol/L (ref 22–32)
Calcium: 9.3 mg/dL (ref 8.9–10.3)
Creatinine, Ser: 0.67 mg/dL (ref 0.44–1.00)
GFR calc Af Amer: >60 mL/min (ref >60)
Glucose, Bld: 105 mg/dL — ABNORMAL HIGH (ref 65–99)
POTASSIUM: 3.8 mmol/L (ref 3.5–5.1)
Sodium: 132 mmol/L — ABNORMAL LOW (ref 135–145)
TOTAL PROTEIN: 6 g/dL — AB (ref 6.5–8.1)

## 2016-04-20 LAB — CBC
HEMATOCRIT: 28.7 % — AB (ref 36.0–46.0)
Hemoglobin: 9.9 g/dL — ABNORMAL LOW (ref 12.0–15.0)
MCH: 31.9 pg (ref 26.0–34.0)
MCHC: 34.5 g/dL (ref 30.0–36.0)
MCV: 92.6 fL (ref 78.0–100.0)
PLATELETS: 230 10*3/uL (ref 150–400)
RBC: 3.1 MIL/uL — ABNORMAL LOW (ref 3.87–5.11)
RDW: 12.9 % (ref 11.5–15.5)
WBC: 10.9 10*3/uL — AB (ref 4.0–10.5)

## 2016-04-20 NOTE — Progress Notes (Signed)
PPD # 1 SVD Information for the patient's newborn:  [name], Chloe Elliott  female    breast and bottle feeding / difficulty latching Baby name: Chloe Elliott  S:  Reports feeling well.             Tolerating po/ No nausea or vomiting             Bleeding is light             Pain controlled with PO meds             Up ad lib / ambulatory / voiding without difficulties        O:  A & O x 3, in no apparent distress              VS:  Vitals:   04/19/16 1932 04/19/16 2137 04/19/16 2336 04/20/16 0608  BP: 120/68 (!) 146/86 134/66 120/74  Pulse: 66 93 83 71  Resp: 20  16 16   Temp: 97.7 F (36.5 C)  98.4 F (36.9 C) 98.1 F (36.7 C)  TempSrc: Oral  Oral Oral  Weight:      Height:        LABS:  Recent Labs  04/19/16 1654 04/20/16 0516  WBC 12.9* 10.9*  HGB 11.7* 9.9*  HCT 33.8* 28.7*  PLT 263 230    Blood type: --/--/A POS (11/12 0705)  Rubella:   immune  I&O: I/O last 3 completed shifts: In: -  Out: 1180 [Urine:950; Blood:230]          No intake/output data recorded.  Lungs: Clear and unlabored  Heart: regular rate and rhythm / no murmurs  Abdomen: soft, non-tender, non-distended             Fundus: firm, non-tender, U-1  Perineum: repair intact, mild edema  Lochia: scant  Extremities: + edema, no calf pain or tenderness    A/P: PPD # 1 35 y.o., [name]   Principal Problem:   Postpartum care following vaginal delivery (11/12) Active Problems:   Essential hypertension, benign   ROM (rupture of membranes), premature  BP stable on Aldomet 500 mg PO TID    Doing well - stable status  Routine post partum orders  Anticipate discharge tomorrow    Juliene Pina, MSN, CNM 04/20/2016, 9:42 AM

## 2016-04-20 NOTE — Lactation Note (Signed)
This note was copied from a baby's chart. Lactation Consultation Note  Patient Name: Chloe Elliott [id number] Date: 04/20/2016   Met with Mom this morning, baby 35 hrs old and hasn't latched at last 3 attempts.  Baby [redacted]w[redacted]d and discussed normal LPT behavior.  Encouraged STS and feeding at least every 3 hrs.  Baby received a bottle of 10 ml formula an hour prior.  Mom with a room full of visitors, and stated she would call for help with BFing later.  DEBP set up at bedside.  Mom had been instructed to pump both breasts every 3 hrs to support a full milk supply due to baby being 36 2/7 weeks.     SBroadus John11/13/2017, 1:08 PM

## 2016-04-20 NOTE — Anesthesia Postprocedure Evaluation (Signed)
Anesthesia Post Note  Patient: Chloe Elliott  Procedure(s) Performed: * No procedures listed *  Patient location during evaluation: Mother Baby Anesthesia Type: Epidural Level of consciousness: awake, awake and alert and oriented Pain management: pain level controlled Vital Signs Assessment: post-procedure vital signs reviewed and stable Respiratory status: spontaneous breathing, nonlabored ventilation and respiratory function stable Cardiovascular status: stable Postop Assessment: no headache, no backache, patient able to bend at knees, no signs of nausea or vomiting and adequate PO intake Anesthetic complications: no     Last Vitals:  Vitals:   04/19/16 2336 04/20/16 0608  BP: 134/66 120/74  Pulse: 83 71  Resp: 16 16  Temp: 36.9 C 36.7 C    Last Pain:  Vitals:   04/20/16 0810  TempSrc:   PainSc: 1    Pain Goal: Patients Stated Pain Goal: 4 (04/19/16 1130)               Garnell Begeman

## 2016-04-21 MED ORDER — IBUPROFEN 600 MG PO TABS: 600.0000 mg | ORAL_TABLET | Freq: Four times a day (QID) | ORAL | 0 refills | Status: DC

## 2016-04-21 MED ORDER — LABETALOL HCL 100 MG PO TABS: 100.0000 mg | ORAL_TABLET | Freq: Three times a day (TID) | ORAL | 0 refills | Status: DC

## 2016-04-21 NOTE — Lactation Note (Signed)
This note was copied from a baby's chart. Lactation Consultation Note Mom had just finished giving baby a bottle w/formula. Mom stated baby still isn't latching well on breast. Offered to assist now or next feeding time. Mom stated other nurse's and LC's has tried to help but the baby isn't ready or doesn't want to BF yet. Mom stated that she is going to pump and bottle feed.  Has colostrum, hand expressing and syring feeding to baby.  Encouraged to call if needs assistance in latching or has any questions. Reviewed supply and demand.  Patient Name: Chloe Elliott [id number] Date: 04/21/2016 Reason for consult: Follow-up assessment   Maternal Data    Feeding Feeding Type: Bottle Fed - Formula Nipple Type: Slow - flow Length of feed: 15 min  LATCH Score/Interventions       Intervention(s): Double electric pump  Comfort (Breast/Nipple): Filling, red/small blisters or bruises, mild/mod discomfort  Problem noted: Mild/Moderate discomfort Interventions (Mild/moderate discomfort): Post-pump;Hand massage;Hand expression  Intervention(s): Breastfeeding basics reviewed;Skin to skin;Position options;Support Pillows     Lactation Tools Discussed/Used Tools: Pump Breast pump type: Double-Electric Breast Pump   Consult Status Consult Status: Follow-up Date: 04/21/16 Follow-up type: In-patient    Yong Wahlquist, Elta Guadeloupe 04/21/2016, 1:51 AM

## 2016-04-21 NOTE — Progress Notes (Addendum)
PPD # 2 SVD Information for the patient's newborn:  [name], [name] [id number]  female    breast and bottle feeding / difficulty latching Baby name: Cresenciano Lick:  Reports feeling well.             Tolerating po/ No nausea or vomiting             Bleeding is light             Pain controlled with ibuprofen (OTC)             Up ad lib / ambulatory / voiding without difficulties     O:  A & O x 3, in no apparent distress              VS:  Vitals:   04/20/16 2030 04/20/16 2252 04/21/16 0545 04/21/16 0939  BP: (!) 145/89 (!) 145/89 135/70 (!) 158/78  Pulse: 79 70 78 84  Resp: 16  18   Temp: 98.9 F (37.2 C)  97.9 F (36.6 C)   TempSrc: Oral     SpO2: 100%     Weight:      Height:        LABS:   Recent Labs  04/19/16 1654 04/20/16 0516  WBC 12.9* 10.9*  HGB 11.7* 9.9*  HCT 33.8* 28.7*  PLT 263 230    Blood type: --/--/A POS (11/12 0705)  Rubella: Immune    Lungs: Clear and unlabored  Heart: regular rate and rhythm / no murmurs  Abdomen: soft, non-tender, non-distended             Fundus: firm, non-tender, U-2  Perineum: repair intact, mild edema  Lochia: scant  Extremities: Trace edema, no calf pain or tenderness    A/P: PPD # 2 35 y.o., [name]   Principal Problem:    Postpartum care following vaginal delivery (11/12)  Active Problems:    Essential hypertension, benign    ROM (rupture of membranes), premature  BP stable on Aldomet 500 mg PO TID    Doing well - stable status  Routine post partum orders  D/C home today   Continue Labetalol 100 mg TID and Aldomet 500 mg TID  Return to [facility] for BP re-check in 1 wk    Laury Deep, M, MSN, CNM 04/21/2016, 8:38 AM

## 2016-04-21 NOTE — Discharge Instructions (Signed)
Postpartum Depression and Baby Blues The postpartum period begins right after the birth of a baby. During this time, there is often a great amount of joy and excitement. It is also a time of many changes in the life of the parents. Regardless of how many times a mother gives birth, each child brings new challenges and dynamics to the family. It is not unusual to have feelings of excitement along with confusing shifts in moods, emotions, and thoughts. All mothers are at risk of developing postpartum depression or the "baby blues." These mood changes can occur right after giving birth, or they may occur many months after giving birth. The baby blues or postpartum depression can be mild or severe. Additionally, postpartum depression can go away rather quickly, or it can be a long-term condition. What are the causes? Raised hormone levels and the rapid drop in those levels are thought to be a main cause of postpartum depression and the baby blues. A number of hormones change during and after pregnancy. Estrogen and progesterone usually decrease right after the delivery of your baby. The levels of thyroid hormone and various cortisol steroids also rapidly drop. Other factors that play a role in these mood changes include major life events and genetics. What increases the risk? If you have any of the following risks for the baby blues or postpartum depression, know what symptoms to watch out for during the postpartum period. Risk factors that may increase the likelihood of getting the baby blues or postpartum depression include:  Having a personal or family history of depression.  Having depression while being pregnant.  Having premenstrual mood issues or mood issues related to oral contraceptives.  Having a lot of life stress.  Having marital conflict.  Lacking a social support network.  Having a baby with special needs.  Having health problems, such as diabetes. What are the signs or  symptoms? Symptoms of baby blues include:  Brief changes in mood, such as going from extreme happiness to sadness.  Decreased concentration.  Difficulty sleeping.  Crying spells, tearfulness.  Irritability.  Anxiety. Symptoms of postpartum depression typically begin within the first month after giving birth. These symptoms include:  Difficulty sleeping or excessive sleepiness.  Marked weight loss.  Agitation.  Feelings of worthlessness.  Lack of interest in activity or food. Postpartum psychosis is a very serious condition and can be dangerous. Fortunately, it is rare. Displaying any of the following symptoms is cause for immediate medical attention. Symptoms of postpartum psychosis include:  Hallucinations and delusions.  Bizarre or disorganized behavior.  Confusion or disorientation. How is this diagnosed? A diagnosis is made by an evaluation of your symptoms. There are no medical or lab tests that lead to a diagnosis, but there are various questionnaires that a health care provider may use to identify those with the baby blues, postpartum depression, or psychosis. Often, a screening tool called the [place] Postnatal Depression Scale is used to diagnose depression in the postpartum period. How is this treated? The baby blues usually goes away on its own in 1-2 weeks. Social support is often all that is needed. You will be encouraged to get adequate sleep and rest. Occasionally, you may be given medicines to help you sleep. Postpartum depression requires treatment because it can last several months or longer if it is not treated. Treatment may include individual or group therapy, medicine, or both to address any social, physiological, and psychological factors that may play a role in the depression. Regular exercise, a  healthy diet, rest, and social support may also be strongly recommended. Postpartum psychosis is more serious and needs treatment right away. Hospitalization is  often needed. Follow these instructions at home:  Get as much rest as you can. Nap when the baby sleeps.  Exercise regularly. Some women find yoga and walking to be beneficial.  Eat a balanced and nourishing diet.  Do little things that you enjoy. Have a cup of tea, take a bubble bath, read your favorite magazine, or listen to your favorite music.  Avoid alcohol.  Ask for help with household chores, cooking, grocery shopping, or running errands as needed. Do not try to do everything.  Talk to people close to you about how you are feeling. Get support from your partner, family members, friends, or other new moms.  Try to stay positive in how you think. Think about the things you are grateful for.  Do not spend a lot of time alone.  Only take over-the-counter or prescription medicine as directed by your health care provider.  Keep all your postpartum appointments.  Let your health care provider know if you have any concerns. Contact a health care provider if: You are having a reaction to or problems with your medicine. Get help right away if:  You have suicidal feelings.  You think you may harm the baby or someone else. This information is not intended to replace advice given to you by your health care provider. Make sure you discuss any questions you have with your health care provider. Document Released: 02/27/2004 Document Revised: 10/31/2015 Document Reviewed: 03/06/2013 Elsevier Interactive Patient Education  2017 [place]. Postpartum Care After Vaginal Delivery The period of time right after you deliver your newborn is called the postpartum period. What kind of medical care will I receive?  You may continue to receive fluids and medicines through an IV tube inserted into one of your veins.  If an incision was made near your vagina (episiotomy) or if you had some vaginal tearing during delivery, cold compresses may be placed on your episiotomy or your tear. This  helps to reduce pain and swelling.  You may be given a squirt bottle to use when you go to the bathroom. You may use this until you are comfortable wiping as usual. To use the squirt bottle, follow these steps:  Before you urinate, fill the squirt bottle with warm water. Do not use hot water.  After you urinate, while you are sitting on the toilet, use the squirt bottle to rinse the area around your urethra and vaginal opening. This rinses away any urine and blood.  You may do this instead of wiping. As you start healing, you may use the squirt bottle before wiping yourself. Make sure to wipe gently.  Fill the squirt bottle with clean water every time you use the bathroom.  You will be given sanitary pads to wear. How can I expect to feel?  You may not feel the need to urinate for several hours after delivery.  You will have some soreness and pain in your abdomen and vagina.  If you are breastfeeding, you may have uterine contractions every time you breastfeed for up to several weeks postpartum. Uterine contractions help your uterus return to its normal size.  It is normal to have vaginal bleeding (lochia) after delivery. The amount and appearance of lochia is often similar to a menstrual period in the first week after delivery. It will gradually decrease over the next few weeks to a  dry, yellow-brown discharge. For most women, lochia stops completely by 6-8 weeks after delivery. Vaginal bleeding can vary from woman to woman.  Within the first few days after delivery, you may have breast engorgement. This is when your breasts feel heavy, full, and uncomfortable. Your breasts may also throb and feel hard, tightly stretched, warm, and tender. After this occurs, you may have milk leaking from your breasts.Your health care provider can help you relieve discomfort due to breast engorgement. Breast engorgement should go away within a few days.  You may feel more sad or worried than normal due to  hormonal changes after delivery. These feelings should not last more than a few days. If these feelings do not go away after several days, speak with your health care provider. How should I care for myself?  Tell your health care provider if you have pain or discomfort.  Drink enough water to keep your urine clear or pale yellow.  Wash your hands thoroughly with soap and water for at least 20 seconds after changing your sanitary pads, after using the toilet, and before holding or feeding your baby.  If you are not breastfeeding, avoid touching your breasts a lot. Doing this can make your breasts produce more milk.  If you become weak or lightheaded, or you feel like you might faint, ask for help before:  Getting out of bed.  Showering.  Change your sanitary pads frequently. Watch for any changes in your flow, such as a sudden increase in volume, a change in color, the passing of large blood clots. If you pass a blood clot from your vagina, save it to show to your health care provider. Do not flush blood clots down the toilet without having your health care provider look at them.  Make sure that all your vaccinations are up to date. This can help protect you and your baby from getting certain diseases. You may need to have immunizations done before you leave the hospital.  If desired, talk with your health care provider about methods of family planning or birth control (contraception). How can I start bonding with my baby? Spending as much time as possible with your baby is very important. During this time, you and your baby can get to know each other and develop a bond. Having your baby stay with you in your room (rooming in) can give you time to get to know your baby. Rooming in can also help you become comfortable caring for your baby. Breastfeeding can also help you bond with your baby. How can I plan for returning home with my baby?  Make sure that you have a car seat installed in your  vehicle.  Your car seat should be checked by a certified car seat installer to make sure that it is installed safely.  Make sure that your baby fits into the car seat safely.  Ask your health care provider any questions you have about caring for yourself or your baby. Make sure that you are able to contact your health care provider with any questions after leaving the hospital. This information is not intended to replace advice given to you by your health care provider. Make sure you discuss any questions you have with your health care provider. Document Released: 03/22/2007 Document Revised: 10/28/2015 Document Reviewed: 04/29/2015 Elsevier Interactive Patient Education  2017 [place]. Breastfeeding and Mastitis Mastitis is inflammation of the breast tissue. It can occur in women who are breastfeeding. This can make breastfeeding painful. Mastitis will  sometimes go away on its own. Your health care provider will help determine if treatment is needed. CAUSES Mastitis is often associated with a blocked milk (lactiferous) duct. This can happen when too much milk builds up in the breast. Causes of excess milk in the breast can include:  Poor latch-on. If your baby is not latched onto the breast properly, she or he may not empty your breast completely while breastfeeding.  Allowing too much time to pass between feedings.  Wearing a bra or other clothing that is too tight. This puts extra pressure on the lactiferous ducts so milk does not flow through them as it should. Mastitis can also be caused by a bacterial infection. Bacteria may enter the breast tissue through cuts or openings in the skin. In women who are breastfeeding, this may occur because of cracked or irritated skin. Cracks in the skin are often caused when your baby does not latch on properly to the breast. SIGNS AND SYMPTOMS  Swelling, redness, tenderness, and pain in an area of the breast.  Swelling of the glands under the  arm on the same side.  Fever may or may not accompany mastitis. If an infection is allowed to progress, a collection of pus (abscess) may develop. DIAGNOSIS  Your health care provider can usually diagnose mastitis based on your symptoms and a physical exam. Tests may be done to help confirm the diagnosis. These may include:  Removal of pus from the breast by applying pressure to the area. This pus can be examined in the lab to determine which bacteria are present. If an abscess has developed, the fluid in the abscess can be removed with a needle. This can also be used to confirm the diagnosis and determine the bacteria present. In most cases, pus will not be present.  Blood tests to determine if your body is fighting a bacterial infection.  Mammogram or ultrasound tests to rule out other problems or diseases. TREATMENT  Mastitis that occurs with breastfeeding will sometimes go away on its own. Your health care provider may choose to wait 24 hours after first seeing you to decide whether a prescription medicine is needed. If your symptoms are worse after 24 hours, your health care provider will likely prescribe an antibiotic medicine to treat the mastitis. He or she will determine which bacteria are most likely causing the infection and will then select an appropriate antibiotic medicine. This is sometimes changed based on the results of tests performed to identify the bacteria, or if there is no response to the antibiotic medicine selected. Antibiotic medicines are usually given by mouth. You may also be given medicine for pain. HOME CARE INSTRUCTIONS  Only take over-the-counter or prescription medicines for pain, fever, or discomfort as directed by your health care provider.  If your health care provider prescribed an antibiotic medicine, take the medicine as directed. Make sure you finish it even if you start to feel better.  Do not wear a tight or underwire bra. Wear a soft, supportive  bra.  Increase your fluid intake, especially if you have a fever.  Continue to empty the breast. Your health care provider can tell you whether this milk is safe for your infant or needs to be thrown out. You may be told to stop nursing until your health care provider thinks it is safe for your baby. Use a breast pump if you are advised to stop nursing.  Keep your nipples clean and dry.  Empty the first breast completely  before going to the other breast. If your baby is not emptying your breasts completely for some reason, use a breast pump to empty your breasts.  If you go back to work, pump your breasts while at work to stay in time with your nursing schedule.  Avoid allowing your breasts to become overly filled with milk (engorged). SEEK MEDICAL CARE IF:  You have pus-like discharge from the breast.  Your symptoms do not improve with the treatment prescribed by your health care provider within 2 days. SEEK IMMEDIATE MEDICAL CARE IF:  Your pain and swelling are getting worse.  You have pain that is not controlled with medicine.  You have a red line extending from the breast toward your armpit.  You have a fever or persistent symptoms for more than 2-3 days.  You have a fever and your symptoms suddenly get worse. MAKE SURE YOU:   Understand these instructions.  Will watch your condition.  Will get help right away if you are not doing well or get worse. This information is not intended to replace advice given to you by your health care provider. Make sure you discuss any questions you have with your health care provider. Document Released: 09/19/2004 Document Revised: 05/30/2013 Document Reviewed: 12/29/2012 Elsevier Interactive Patient Education  2017 [employer]. Breastfeeding Deciding to breastfeed is one of the best choices you can make for you and your baby. A change in hormones during pregnancy causes your breast tissue to grow and increases the number and size of your  milk ducts. These hormones also allow proteins, sugars, and fats from your blood supply to make breast milk in your milk-producing glands. Hormones prevent breast milk from being released before your baby is born as well as prompt milk flow after birth. Once breastfeeding has begun, thoughts of your baby, as well as his or her sucking or crying, can stimulate the release of milk from your milk-producing glands. Benefits of breastfeeding For Your Baby  Your first milk (colostrum) helps your baby's digestive system function better.  There are antibodies in your milk that help your baby fight off infections.  Your baby has a lower incidence of asthma, allergies, and sudden infant death syndrome.  The nutrients in breast milk are better for your baby than infant formulas and are designed uniquely for your babys needs.  Breast milk improves your baby's brain development.  Your baby is less likely to develop other conditions, such as childhood obesity, asthma, or type 2 diabetes mellitus. For You  Breastfeeding helps to create a very special bond between you and your baby.  Breastfeeding is convenient. Breast milk is always available at the correct temperature and costs nothing.  Breastfeeding helps to burn calories and helps you lose the weight gained during pregnancy.  Breastfeeding makes your uterus contract to its prepregnancy size faster and slows bleeding (lochia) after you give birth.  Breastfeeding helps to lower your risk of developing type 2 diabetes mellitus, osteoporosis, and breast or ovarian cancer later in life. Signs that your baby is hungry Early Signs of Hunger  Increased alertness or activity.  Stretching.  Movement of the head from side to side.  Movement of the head and opening of the mouth when the corner of the mouth or cheek is stroked (rooting).  Increased sucking sounds, smacking lips, cooing, sighing, or squeaking.  Hand-to-mouth movements.  Increased  sucking of fingers or hands. Late Signs of Hunger  Fussing.  Intermittent crying. Extreme Signs of Hunger  Signs of  extreme hunger will require calming and consoling before your baby will be able to breastfeed successfully. Do not wait for the following signs of extreme hunger to occur before you initiate breastfeeding:  Restlessness.  A loud, strong cry.  Screaming. Breastfeeding basics  Breastfeeding Initiation  Find a comfortable place to sit or lie down, with your neck and back well supported.  Place a pillow or rolled up blanket under your baby to bring him or her to the level of your breast (if you are seated). Nursing pillows are specially designed to help support your arms and your baby while you breastfeed.  Make sure that your baby's abdomen is facing your abdomen.  Gently massage your breast. With your fingertips, massage from your chest wall toward your nipple in a circular motion. This encourages milk flow. You may need to continue this action during the feeding if your milk flows slowly.  Support your breast with 4 fingers underneath and your thumb above your nipple. Make sure your fingers are well away from your nipple and your babys mouth.  Stroke your baby's lips gently with your finger or nipple.  When your baby's mouth is open wide enough, quickly bring your baby to your breast, placing your entire nipple and as much of the colored area around your nipple (areola) as possible into your baby's mouth.  More areola should be visible above your baby's upper lip than below the lower lip.  Your baby's tongue should be between his or her lower gum and your breast.  Ensure that your baby's mouth is correctly positioned around your nipple (latched). Your baby's lips should create a seal on your breast and be turned out (everted).  It is common for your baby to suck about 2-3 minutes in order to start the flow of breast milk. Latching  Teaching your baby how to latch  on to your breast properly is very important. An improper latch can cause nipple pain and decreased milk supply for you and poor weight gain in your baby. Also, if your baby is not latched onto your nipple properly, he or she may swallow some air during feeding. This can make your baby fussy. Burping your baby when you switch breasts during the feeding can help to get rid of the air. However, teaching your baby to latch on properly is still the best way to prevent fussiness from swallowing air while breastfeeding. Signs that your baby has successfully latched on to your nipple:  Silent tugging or silent sucking, without causing you pain.  Swallowing heard between every 3-4 sucks.  Muscle movement above and in front of his or her ears while sucking. Signs that your baby has not successfully latched on to nipple:  Sucking sounds or smacking sounds from your baby while breastfeeding.  Nipple pain. If you think your baby has not latched on correctly, slip your finger into the corner of your babys mouth to break the suction and place it between your baby's gums. Attempt breastfeeding initiation again. Signs of Successful Breastfeeding  Signs from your baby:  A gradual decrease in the number of sucks or complete cessation of sucking.  Falling asleep.  Relaxation of his or her body.  Retention of a small amount of milk in his or her mouth.  Letting go of your breast by himself or herself. Signs from you:  Breasts that have increased in firmness, weight, and size 1-3 hours after feeding.  Breasts that are softer immediately after breastfeeding.  Increased milk volume,  as well as a change in milk consistency and color by the fifth day of breastfeeding.  Nipples that are not sore, cracked, or bleeding. Signs That Your [employer] is Getting Enough Milk  Wetting at least 1-2 diapers during the first 24 hours after birth.  Wetting at least 5-6 diapers every 24 hours for the first week after  birth. The urine should be clear or pale yellow by 5 days after birth.  Wetting 6-8 diapers every 24 hours as your baby continues to grow and develop.  At least 3 stools in a 24-hour period by age 20 days. The stool should be soft and yellow.  At least 3 stools in a 24-hour period by age 80 days. The stool should be seedy and yellow.  No loss of weight greater than 10% of birth weight during the first 60 days of age.  Average weight gain of 4-7 ounces (113-198 g) per week after age 42 days.  Consistent daily weight gain by age 19 days, without weight loss after the age of 2 weeks. After a feeding, your baby may spit up a small amount. This is common. Breastfeeding frequency and duration Frequent feeding will help you make more milk and can prevent sore nipples and breast engorgement. Breastfeed when you feel the need to reduce the fullness of your breasts or when your baby shows signs of hunger. This is called "breastfeeding on demand." Avoid introducing a pacifier to your baby while you are working to establish breastfeeding (the first 4-6 weeks after your baby is born). After this time you may choose to use a pacifier. Research has shown that pacifier use during the first year of a baby's life decreases the risk of sudden infant death syndrome (SIDS). Allow your baby to feed on each breast as long as he or she wants. Breastfeed until your baby is finished feeding. When your baby unlatches or falls asleep while feeding from the first breast, offer the second breast. Because newborns are often sleepy in the first few weeks of life, you may need to awaken your baby to get him or her to feed. Breastfeeding times will vary from baby to baby. However, the following rules can serve as a guide to help you ensure that your baby is properly fed:  Newborns (babies 41 weeks of age or younger) may breastfeed every 1-3 hours.  Newborns should not go longer than 3 hours during the day or 5 hours during the night  without breastfeeding.  You should breastfeed your baby a minimum of 8 times in a 24-hour period until you begin to introduce solid foods to your baby at around 27 months of age. Breast milk pumping Pumping and storing breast milk allows you to ensure that your baby is exclusively fed your breast milk, even at times when you are unable to breastfeed. This is especially important if you are going back to work while you are still breastfeeding or when you are not able to be present during feedings. Your lactation consultant can give you guidelines on how long it is safe to store breast milk. A breast pump is a machine that allows you to pump milk from your breast into a sterile bottle. The pumped breast milk can then be stored in a refrigerator or freezer. Some breast pumps are operated by hand, while others use electricity. Ask your lactation consultant which type will work best for you. Breast pumps can be purchased, but some hospitals and breastfeeding support groups lease breast pumps on  a monthly basis. A lactation consultant can teach you how to hand express breast milk, if you prefer not to use a pump. Caring for your breasts while you breastfeed Nipples can become dry, cracked, and sore while breastfeeding. The following recommendations can help keep your breasts moisturized and healthy:  Avoid using soap on your nipples.  Wear a supportive bra. Although not required, special nursing bras and tank tops are designed to allow access to your breasts for breastfeeding without taking off your entire bra or top. Avoid wearing underwire-style bras or extremely tight bras.  Air dry your nipples for 3-37minutes after each feeding.  Use only cotton bra pads to absorb leaked breast milk. Leaking of breast milk between feedings is normal.  Use lanolin on your nipples after breastfeeding. Lanolin helps to maintain your skin's normal moisture barrier. If you use pure lanolin, you do not need to wash it off  before feeding your baby again. Pure lanolin is not toxic to your baby. You may also hand express a few drops of breast milk and gently massage that milk into your nipples and allow the milk to air dry. In the first few weeks after giving birth, some women experience extremely full breasts (engorgement). Engorgement can make your breasts feel heavy, warm, and tender to the touch. Engorgement peaks within 3-5 days after you give birth. The following recommendations can help ease engorgement:  Completely empty your breasts while breastfeeding or pumping. You may want to start by applying warm, moist heat (in the shower or with warm water-soaked hand towels) just before feeding or pumping. This increases circulation and helps the milk flow. If your baby does not completely empty your breasts while breastfeeding, pump any extra milk after he or she is finished.  Wear a snug bra (nursing or regular) or tank top for 1-2 days to signal your body to slightly decrease milk production.  Apply ice packs to your breasts, unless this is too uncomfortable for you.  Make sure that your baby is latched on and positioned properly while breastfeeding. If engorgement persists after 48 hours of following these recommendations, contact your health care provider or a Science writer. Overall health care recommendations while breastfeeding  Eat healthy foods. Alternate between meals and snacks, eating 3 of each per day. Because what you eat affects your breast milk, some of the foods may make your baby more irritable than usual. Avoid eating these foods if you are sure that they are negatively affecting your baby.  Drink milk, fruit juice, and water to satisfy your thirst (about 10 glasses a day).  Rest often, relax, and continue to take your prenatal vitamins to prevent fatigue, stress, and anemia.  Continue breast self-awareness checks.  Avoid chewing and smoking tobacco. Chemicals from cigarettes that pass  into breast milk and exposure to secondhand smoke may harm your baby.  Avoid alcohol and drug use, including marijuana. Some medicines that may be harmful to your baby can pass through breast milk. It is important to ask your health care provider before taking any medicine, including all over-the-counter and prescription medicine as well as vitamin and herbal supplements. It is possible to become pregnant while breastfeeding. If birth control is desired, ask your health care provider about options that will be safe for your baby. Contact a health care provider if:  You feel like you want to stop breastfeeding or have become frustrated with breastfeeding.  You have painful breasts or nipples.  Your nipples are cracked or bleeding.  Your breasts are red, tender, or warm.  You have a swollen area on either breast.  You have a fever or chills.  You have nausea or vomiting.  You have drainage other than breast milk from your nipples.  Your breasts do not become full before feedings by the fifth day after you give birth.  You feel sad and depressed.  Your baby is too sleepy to eat well.  Your baby is having trouble sleeping.  Your baby is wetting less than 3 diapers in a 24-hour period.  Your baby has less than 3 stools in a 24-hour period.  Your baby's skin or the white part of his or her eyes becomes yellow.  Your baby is not gaining weight by 63 days of age. Get help right away if:  Your baby is overly tired (lethargic) and does not want to wake up and feed.  Your baby develops an unexplained fever. This information is not intended to replace advice given to you by your health care provider. Make sure you discuss any questions you have with your health care provider. Document Released: 05/25/2005 Document Revised: 11/06/2015 Document Reviewed: 11/16/2012 Elsevier Interactive Patient Education  2017 [place] Tips If you are breastfeeding, there may be  times when you cannot feed your baby directly. Returning to work or going on a trip are common examples. Pumping allows you to store breast milk and feed it to your baby later. You may not get much milk when you first start to pump. Your breasts should start to make more after a few days. If you pump at the times you usually feed your baby, you may be able to keep making enough milk to feed your baby without also using formula. The more often you pump, the more milk you will produce. When should I pump?  You can begin to pump soon after delivery. However, some experts recommend waiting about 4 weeks before giving your infant a bottle to make sure breastfeeding is going well.  If you plan to return to work, begin pumping a few weeks before. This will help you develop techniques that work best for you. It also lets you build up a supply of breast milk.  When you are with your infant, feed on demand and pump after each feeding.  When you are away from your infant for several hours, pump for about 15 minutes every 2-3 hours. Pump both breasts at the same time if you can.  If your infant has a formula feeding, make sure to pump around the same time.  If you drink any alcohol, wait 2 hours before pumping. How do I prepare to pump? Your let-down reflexis the natural reaction to stimulation that makes your breast milk flow. It is easier to stimulate this reflex when you are relaxed. Find relaxation techniques that work for you. If you have difficulty with your let-down reflex, try these methods:  Smell one of your infant's blankets or an item of clothing.  Look at a picture or video of your infant.  Sit in a quiet, private space.  Massage the breast you plan to pump.  Place soothing warmth on the breast.  Play relaxing music. What are some general breast pumping tips?  Wash your hands before you pump. You do not need to wash your nipples or breasts.  There are three ways to pump.  You can  use your hand to massage and compress your breast.  You can use a handheld manual  pump.  You can use an electric pump.  Make sure the suction cup (flange) on the breast pump is the right size. Place the flange directly over the nipple. If it is the wrong size or placed the wrong way, it may be painful and cause nipple damage.  If pumping is uncomfortable, apply a small amount of purified or modified lanolin to your nipple and areola.  If you are using an electric pump, adjust the speed and suction power to be more comfortable.  If pumping is painful or if you are not getting very much milk, you may need a different type of pump. A lactation consultant can help you determine what type of pump to use.  Keep a full water bottle near you at all times. Drinking lots of fluid helps you make more milk.  You can store your milk to use later. Pumped breast milk can be stored in a sealable, sterile container or plastic bag. Label all stored breast milk with the date you pumped it.  Milk can stay out at room temperature for up to 8 hours.  You can store your milk in the refrigerator for up to 8 days.  You can store your milk in the freezer for 3 months. Thaw frozen milk using warm water. Do not put it in the microwave.  Do not smoke. Smoking can lower your milk supply and harm your infant. If you need help quitting, ask your health care provider to recommend a program. When should I call my health care provider or a lactation consultant?  You are having trouble pumping.  You are concerned that you are not making enough milk.  You have nipple pain, soreness, or redness.  You want to use birth control. Birth control pills may lower your milk supply. Talk to your health care provider about your options. This information is not intended to replace advice given to you by your health care provider. Make sure you discuss any questions you have with your health care provider. Document Released:  11/12/2009 Document Revised: 11/06/2015 Document Reviewed: 03/17/2013 Elsevier Interactive Patient Education  2017 [employer].

## 2016-04-21 NOTE — Discharge Summary (Signed)
OB Discharge Summary     Patient Name: Chloe Elliott DOB: 05-26-1981 MRN: [id number]  Date of admission: 04/19/2016 Delivering MD: Princess Bruins   Date of discharge: 04/21/2016  Admitting diagnosis: 37Wks [place] Intrauterine pregnancy: [redacted]w[redacted]d     Secondary diagnosis:  Principal Problem:   Postpartum care following vaginal delivery (11/12) Active Problems:   Essential hypertension, benign   ROM (rupture of membranes), premature  Additional problems: none     Discharge diagnosis: Term Pregnancy Delivered and CHTN                                                                                                Post partum procedures:none  Augmentation: Pitocin  Complications: None  Hospital course:  Onset of Labor With Vaginal Delivery     35 y.o. yo [id number] at [redacted]w[redacted]d was admitted in Latent Labor on 04/19/2016. Patient had an uncomplicated labor course as follows:  Membrane Rupture Time/Date: 4:30 AM ,04/19/2016   Intrapartum Procedures: Episiotomy: None [1]                                         Lacerations:     Patient had a delivery of a Viable infant. 04/19/2016  Information for the patient's newborn:  Shenita, Sandifer [id number]  Delivery Method: Vaginal, Spontaneous Delivery (Filed from Delivery Summary)    Pateint had an uncomplicated postpartum course.  She is ambulating, tolerating a regular diet, passing flatus, and urinating well. Patient is discharged home in stable condition on 04/21/16.    Physical exam  Vitals:   04/20/16 2030 04/20/16 2252 04/21/16 0545 04/21/16 0939  BP: (!) 145/89 (!) 145/89 135/70 (!) 158/78  Pulse: 79 70 78 84  Resp: 16  18   Temp: 98.9 F (37.2 C)  97.9 F (36.6 C)   TempSrc: Oral     SpO2: 100%     Weight:      Height:       General: alert, cooperative and no distress Lochia: appropriate Uterine Fundus: firm, midline, U-2 DVT Evaluation: No evidence of DVT seen on physical exam. Negative Homan's sign. No cords  or calf tenderness. Trace calf/ankle edema is present Labs: Lab Results  Component Value Date   WBC 10.9 (H) 04/20/2016   HGB 9.9 (L) 04/20/2016   HCT 28.7 (L) 04/20/2016   MCV 92.6 04/20/2016   PLT 230 04/20/2016   CMP Latest Ref Rng & Units 04/20/2016  Glucose 65 - 99 mg/dL 105(H)  BUN 6 - 20 mg/dL 14  Creatinine 0.44 - 1.00 mg/dL 0.67  Sodium 135 - 145 mmol/L 132(L)  Potassium 3.5 - 5.1 mmol/L 3.8  Chloride 101 - 111 mmol/L 103  CO2 22 - 32 mmol/L 23  Calcium 8.9 - 10.3 mg/dL 9.3  Total Protein 6.5 - 8.1 g/dL 6.0(L)  Total Bilirubin 0.3 - 1.2 mg/dL 0.5  Alkaline Phos 38 - 126 U/L 65  AST 15 - 41 U/L 19  ALT 14 - 54 U/L 12(L)  Discharge instruction: per After Visit Summary and "Baby and Me Booklet".  After visit meds:    Medication List    TAKE these medications   ibuprofen 600 MG tablet Commonly known as:  ADVIL,MOTRIN Take 1 tablet (600 mg total) by mouth every 6 (six) hours.   labetalol 100 MG tablet Commonly known as:  NORMODYNE Take 1 tablet (100 mg total) by mouth 3 (three) times daily.   methyldopa 500 MG tablet Commonly known as:  ALDOMET Take 500 mg by mouth 3 (three) times daily.   prenatal vitamin w/FE, FA 27-1 MG Tabs tablet Take 1 tablet by mouth daily.       Diet: routine diet  Activity: Advance as tolerated. Pelvic rest for 6 weeks.   Outpatient follow up:6 weeks Follow up Appt:No future appointments. Follow up Visit:No Follow-up on file.  Postpartum contraception: Undecided  Newborn Data: Live born female on 04/19/2016 Birth Weight: 6 lb 2.9 oz (2804 g) APGAR: 8, 9  Baby Feeding: Bottle and Breast Disposition:unsure if being d/c'd home today d/t elevated bilirubin levels   04/21/2016 Hallie Ishida, Darrold Span, M, CNM

## 2016-04-21 NOTE — Lactation Note (Signed)
This note was copied from a baby's chart. Lactation Consultation Note  Patient Name: Chloe Elliott [id number] Date: 04/21/2016 Reason for consult: Follow-up assessment  Baby 39 hours old. Mom reports that the baby nursed and was supplemented with EBM and formula 1.5 hours earlier. Mom states that baby is latching better and she is getting more EBM while pumping and hand expressing. Enc mom to keep putting baby to breast first with each feeding, then supplement with EBM/formula as mom has been doing so far. Enc mom to keep post-pumping after each feeding as well. Mom reports that she primarily wants to pump and bottle-feed EBM, but she understands the benefits of having the baby at the breast at this time. Reiterated the benefits of baby to breast especially during the first 2 weeks of lactation.   Offered to assist with a feeding and put this LC's extension of the erase board. Mom states that she may call. Offered to schedule an [facility] outpatient appointment, but mom stated that she would call for one as needed. Mom aware of OP/BFSG and [place] phone line assistance after D/C.   Maternal Data    Feeding Feeding Type: Breast Milk with Formula added Nipple Type: Slow - flow  LATCH Score/Interventions                      Lactation Tools Discussed/Used     Consult Status Consult Status: PRN    Andres Labrum 04/21/2016, 9:28 AM

## 2016-09-22 ENCOUNTER — Encounter: Payer: Commercial Managed Care - PPO | Admitting: Internal Medicine
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
## 2016-10-15 NOTE — Patient Instructions (Addendum)
Test(s) ordered today. Your results will be released to [facility] (or called to you) after review, usually within 72hours after test completion. If any changes need to be made, you will be notified at that same time.  All other Health Maintenance issues reviewed.   All recommended immunizations and age-appropriate screenings are up-to-date or discussed.  No immunizations administered today.   Medications reviewed and updated.  Changes include stopping labetolol and methyldopa and starting lisiinopril-hctz daily.    Your prescription(s) have been submitted to your pharmacy. Please take as directed and contact our office if you believe you are having problem(s) with the medication(s).   Please followup in 4 weeks   Health Maintenance, Female Adopting a healthy lifestyle and getting preventive care can go a long way to promote health and wellness. Talk with your health care provider about what schedule of regular examinations is right for you. This is a good chance for you to check in with your provider about disease prevention and staying healthy. In between checkups, there are plenty of things you can do on your own. Experts have done a lot of research about which lifestyle changes and preventive measures are most likely to keep you healthy. Ask your health care provider for more information. Weight and diet Eat a healthy diet  Be sure to include plenty of vegetables, fruits, low-fat dairy products, and lean protein.  Do not eat a lot of foods high in solid fats, added sugars, or salt.  Get regular exercise. This is one of the most important things you can do for your health.  Most adults should exercise for at least 150 minutes each week. The exercise should increase your heart rate and make you sweat (moderate-intensity exercise).  Most adults should also do strengthening exercises at least twice a week. This is in addition to the moderate-intensity exercise. Maintain a healthy  weight  Body mass index (BMI) is a measurement that can be used to identify possible weight problems. It estimates body fat based on height and weight. Your health care provider can help determine your BMI and help you achieve or maintain a healthy weight.  For females 48 years of age and older:  A BMI below 18.5 is considered underweight.  A BMI of 18.5 to 24.9 is normal.  A BMI of 25 to 29.9 is considered overweight.  A BMI of 30 and above is considered obese. Watch levels of cholesterol and blood lipids  You should start having your blood tested for lipids and cholesterol at 38 years of age, then have this test every 5 years.  You may need to have your cholesterol levels checked more often if:  Your lipid or cholesterol levels are high.  You are older than 38 years of age.  You are at high risk for heart disease. Cancer screening Lung Cancer  Lung cancer screening is recommended for adults 24-39 years old who are at high risk for lung cancer because of a history of smoking.  A yearly low-dose CT scan of the lungs is recommended for people who:  Currently smoke.  Have quit within the past 15 years.  Have at least a 30-pack-year history of smoking. A pack year is smoking an average of one pack of cigarettes a day for 1 year.  Yearly screening should continue until it has been 15 years since you quit.  Yearly screening should stop if you develop a health problem that would prevent you from having lung cancer treatment. Breast Cancer  Practice  breast self-awareness. This means understanding how your breasts normally appear and feel.  It also means doing regular breast self-exams. Let your health care provider know about any changes, no matter how small.  If you are in your 20s or 30s, you should have a clinical breast exam (CBE) by a health care provider every 1-3 years as part of a regular health exam.  If you are 51 or older, have a CBE every year. Also consider  having a breast X-ray (mammogram) every year.  If you have a family history of breast cancer, talk to your health care provider about genetic screening.  If you are at high risk for breast cancer, talk to your health care provider about having an MRI and a mammogram every year.  Breast cancer gene (BRCA) assessment is recommended for women who have family members with BRCA-related cancers. BRCA-related cancers include:  Breast.  Ovarian.  Tubal.  Peritoneal cancers.  Results of the assessment will determine the need for genetic counseling and BRCA1 and BRCA2 testing. Cervical Cancer  Your health care provider may recommend that you be screened regularly for cancer of the pelvic organs (ovaries, uterus, and vagina). This screening involves a pelvic examination, including checking for microscopic changes to the surface of your cervix (Pap test). You may be encouraged to have this screening done every 3 years, beginning at age 92.  For women ages 65-65, health care providers may recommend pelvic exams and Pap testing every 3 years, or they may recommend the Pap and pelvic exam, combined with testing for human papilloma virus (HPV), every 5 years. Some types of HPV increase your risk of cervical cancer. Testing for HPV may also be done on women of any age with unclear Pap test results.  Other health care providers may not recommend any screening for nonpregnant women who are considered low risk for pelvic cancer and who do not have symptoms. Ask your health care provider if a screening pelvic exam is right for you.  If you have had past treatment for cervical cancer or a condition that could lead to cancer, you need Pap tests and screening for cancer for at least 20 years after your treatment. If Pap tests have been discontinued, your risk factors (such as having a new sexual partner) need to be reassessed to determine if screening should resume. Some women have medical problems that increase the  chance of getting cervical cancer. In these cases, your health care provider may recommend more frequent screening and Pap tests. Colorectal Cancer  This type of cancer can be detected and often prevented.  Routine colorectal cancer screening usually begins at 38 years of age and continues through 38 years of age.  Your health care provider may recommend screening at an earlier age if you have risk factors for colon cancer.  Your health care provider may also recommend using home test kits to check for hidden blood in the stool.  A small camera at the end of a tube can be used to examine your colon directly (sigmoidoscopy or colonoscopy). This is done to check for the earliest forms of colorectal cancer.  Routine screening usually begins at age 50.  Direct examination of the colon should be repeated every 5-10 years through 38 years of age. However, you may need to be screened more often if early forms of precancerous polyps or small growths are found. Skin Cancer  Check your skin from head to toe regularly.  Tell your health care provider about any  new moles or changes in moles, especially if there is a change in a mole's shape or color.  Also tell your health care provider if you have a mole that is larger than the size of a pencil eraser.  Always use sunscreen. Apply sunscreen liberally and repeatedly throughout the day.  Protect yourself by wearing long sleeves, pants, a wide-brimmed hat, and sunglasses whenever you are outside. Heart disease, diabetes, and high blood pressure  High blood pressure causes heart disease and increases the risk of stroke. High blood pressure is more likely to develop in:  People who have blood pressure in the high end of the normal range (130-139/85-89 mm Hg).  People who are overweight or obese.  People who are African American.  If you are 57-41 years of age, have your blood pressure checked every 3-5 years. If you are 35 years of age or older,  have your blood pressure checked every year. You should have your blood pressure measured twice-once when you are at a hospital or clinic, and once when you are not at a hospital or clinic. Record the average of the two measurements. To check your blood pressure when you are not at a hospital or clinic, you can use:  An automated blood pressure machine at a pharmacy.  A home blood pressure monitor.  If you are between 62 years and 70 years old, ask your health care provider if you should take aspirin to prevent strokes.  Have regular diabetes screenings. This involves taking a blood sample to check your fasting blood sugar level.  If you are at a normal weight and have a low risk for diabetes, have this test once every three years after 38 years of age.  If you are overweight and have a high risk for diabetes, consider being tested at a younger age or more often. Preventing infection Hepatitis B  If you have a higher risk for hepatitis B, you should be screened for this virus. You are considered at high risk for hepatitis B if:  You were born in a country where hepatitis B is common. Ask your health care provider which countries are considered high risk.  Your parents were born in a high-risk country, and you have not been immunized against hepatitis B (hepatitis B vaccine).  You have HIV or AIDS.  You use needles to inject street drugs.  You live with someone who has hepatitis B.  You have had sex with someone who has hepatitis B.  You get hemodialysis treatment.  You take certain medicines for conditions, including cancer, organ transplantation, and autoimmune conditions. Hepatitis C  Blood testing is recommended for:  Everyone born from [birth year] through 1967.  Anyone with known risk factors for hepatitis C. Sexually transmitted infections (STIs)  You should be screened for sexually transmitted infections (STIs) including gonorrhea and chlamydia if:  You are sexually active  and are younger than 38 years of age.  You are older than 38 years of age and your health care provider tells you that you are at risk for this type of infection.  Your sexual activity has changed since you were last screened and you are at an increased risk for chlamydia or gonorrhea. Ask your health care provider if you are at risk.  If you do not have HIV, but are at risk, it may be recommended that you take a prescription medicine daily to prevent HIV infection. This is called pre-exposure prophylaxis (PrEP). You are considered at risk if:  You are sexually active and do not regularly use condoms or know the HIV status of your partner(s).  You take drugs by injection.  You are sexually active with a partner who has HIV. Talk with your health care provider about whether you are at high risk of being infected with HIV. If you choose to begin PrEP, you should first be tested for HIV. You should then be tested every 3 months for as long as you are taking PrEP. Pregnancy  If you are premenopausal and you may become pregnant, ask your health care provider about preconception counseling.  If you may become pregnant, take 400 to 800 micrograms (mcg) of folic acid every day.  If you want to prevent pregnancy, talk to your health care provider about birth control (contraception). Osteoporosis and menopause  Osteoporosis is a disease in which the bones lose minerals and strength with aging. This can result in serious bone fractures. Your risk for osteoporosis can be identified using a bone density scan.  If you are 73 years of age or older, or if you are at risk for osteoporosis and fractures, ask your health care provider if you should be screened.  Ask your health care provider whether you should take a calcium or vitamin D supplement to lower your risk for osteoporosis.  Menopause may have certain physical symptoms and risks.  Hormone replacement therapy may reduce some of these symptoms  and risks. Talk to your health care provider about whether hormone replacement therapy is right for you. Follow these instructions at home:  Schedule regular health, dental, and eye exams.  Stay current with your immunizations.  Do not use any tobacco products including cigarettes, chewing tobacco, or electronic cigarettes.  If you are pregnant, do not drink alcohol.  If you are breastfeeding, limit how much and how often you drink alcohol.  Limit alcohol intake to no more than 1 drink per day for nonpregnant women. One drink equals 12 ounces of beer, 5 ounces of wine, or 1 ounces of hard liquor.  Do not use street drugs.  Do not share needles.  Ask your health care provider for help if you need support or information about quitting drugs.  Tell your health care provider if you often feel depressed.  Tell your health care provider if you have ever been abused or do not feel safe at home. This information is not intended to replace advice given to you by your health care provider. Make sure you discuss any questions you have with your health care provider. Document Released: 12/08/2010 Document Revised: 10/31/2015 Document Reviewed: 02/26/2015 Elsevier Interactive Patient Education  2017 [employer].

## 2016-10-16 ENCOUNTER — Ambulatory Visit (INDEPENDENT_AMBULATORY_CARE_PROVIDER_SITE_OTHER): Payer: Commercial Managed Care - PPO | Admitting: Internal Medicine

## 2016-10-16 ENCOUNTER — Encounter: Payer: Self-pay | Admitting: Emergency Medicine

## 2016-10-16 ENCOUNTER — Encounter: Payer: Self-pay | Admitting: Internal Medicine

## 2016-10-16 ENCOUNTER — Other Ambulatory Visit (INDEPENDENT_AMBULATORY_CARE_PROVIDER_SITE_OTHER): Payer: Commercial Managed Care - PPO

## 2016-10-16 ENCOUNTER — Other Ambulatory Visit: Payer: Self-pay | Admitting: Internal Medicine

## 2016-10-16 VITALS — BP 144/94 | HR 70 | Temp 98.3°F | Resp 16 | Ht 69.0 in | Wt 223.0 lb

## 2016-10-16 DIAGNOSIS — Z0000 Encounter for general adult medical examination without abnormal findings: Secondary | ICD-10-CM

## 2016-10-16 DIAGNOSIS — R7989 Other specified abnormal findings of blood chemistry: Secondary | ICD-10-CM

## 2016-10-16 DIAGNOSIS — I10 Essential (primary) hypertension: Secondary | ICD-10-CM | POA: Diagnosis not present

## 2016-10-16 LAB — CBC WITH DIFFERENTIAL/PLATELET
BASOS ABS: 0 10*3/uL (ref 0.0–0.1)
BASOS PCT: 0.8 % (ref 0.0–3.0)
EOS ABS: 0.1 10*3/uL (ref 0.0–0.7)
Eosinophils Relative: 2.5 % (ref 0.0–5.0)
HEMATOCRIT: 39.8 % (ref 36.0–46.0)
HEMOGLOBIN: 13.4 g/dL (ref 12.0–15.0)
Lymphocytes Relative: 46.8 % — ABNORMAL HIGH (ref 12.0–46.0)
Lymphs Abs: 1.6 10*3/uL (ref 0.7–4.0)
MCHC: 33.8 g/dL (ref 30.0–36.0)
MCV: 92.9 fl (ref 78.0–100.0)
MONOS PCT: 12.9 % — AB (ref 3.0–12.0)
Monocytes Absolute: 0.5 10*3/uL (ref 0.1–1.0)
NEUTROS ABS: 1.3 10*3/uL — AB (ref 1.4–7.7)
Neutrophils Relative %: 37 % — ABNORMAL LOW (ref 43.0–77.0)
Platelets: 312 10*3/uL (ref 150.0–400.0)
RBC: 4.28 Mil/uL (ref 3.87–5.11)
RDW: 12.3 % (ref 11.5–15.5)
WBC: 3.5 10*3/uL — AB (ref 4.0–10.5)

## 2016-10-16 LAB — COMPREHENSIVE METABOLIC PANEL
ALBUMIN: 4.5 g/dL (ref 3.5–5.2)
ALT: 18 U/L (ref 0–35)
AST: 19 U/L (ref 0–37)
Alkaline Phosphatase: 83 U/L (ref 39–117)
BILIRUBIN TOTAL: 0.5 mg/dL (ref 0.2–1.2)
BUN: 16 mg/dL (ref 6–23)
CALCIUM: 9.6 mg/dL (ref 8.4–10.5)
CO2: 27 meq/L (ref 19–32)
CREATININE: 0.92 mg/dL (ref 0.40–1.20)
Chloride: 104 mEq/L (ref 96–112)
GFR: 87.82 mL/min (ref >60.00)
Glucose, Bld: 85 mg/dL (ref 70–99)
Potassium: 3.8 mEq/L (ref 3.5–5.1)
Sodium: 138 mEq/L (ref 135–145)
Total Protein: 8 g/dL (ref 6.0–8.3)

## 2016-10-16 LAB — LIPID PANEL
CHOL/HDL RATIO: 3
CHOLESTEROL: 151 mg/dL (ref 0–200)
HDL: 60 mg/dL (ref >39.00)
LDL Cholesterol: 82 mg/dL (ref 0–99)
NonHDL: 91.08
TRIGLYCERIDES: 43 mg/dL (ref 0.0–149.0)
VLDL: 8.6 mg/dL (ref 0.0–40.0)

## 2016-10-16 LAB — TSH: TSH: 0.32 u[IU]/mL — ABNORMAL LOW (ref 0.35–4.50)

## 2016-10-16 MED ORDER — LISINOPRIL-HYDROCHLOROTHIAZIDE 20-25 MG PO TABS
1.0000 | ORAL_TABLET | Freq: Every day | ORAL | 3 refills | Status: DC
Start: 2016-10-16 — End: 2017-10-15

## 2016-10-16 NOTE — Assessment & Plan Note (Signed)
BP not controlled on medication - but wants to change since no longer breastfeeding and will not get pregnant again Stop labetalol and methyldopa Start lisinopril - hctz 20-25 mg daily She will monitor closely at home and call with questions Discussed BP goal f/u in 4 weeks, sooner if needed

## 2016-11-04 ENCOUNTER — Telehealth: Payer: Self-pay | Admitting: Internal Medicine

## 2016-11-04 MED ORDER — AMLODIPINE BESYLATE 5 MG PO TABS: 5.0000 mg | ORAL_TABLET | Freq: Every day | ORAL | 3 refills | Status: DC

## 2016-11-04 NOTE — Telephone Encounter (Signed)
Keep taking medication.  Start amlodipine as well - 5 mg daily - sent to [facility].    Has f/u scheduled.

## 2016-11-04 NOTE — Telephone Encounter (Signed)
Patient states Dr. Quay Burow started her on a new BP med and was asked to call back in regard to if BP med was working or not.  Patient states that she does not believe med is working.  Patient has been taking this medication since the 26th at the same time everyday.  Patient states her BP has been running around 140/103, 140/93.

## 2016-11-04 NOTE — Telephone Encounter (Signed)
Spoke with pt to inform.  
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
## 2016-11-12 NOTE — Patient Instructions (Addendum)
  Test(s) ordered today. Your results will be released to MyChart (or called to you) after review, usually within 72hours after test completion. If any changes need to be made, you will be notified at that same time.  Medications reviewed and updated.  No changes recommended at this time.    Please followup in 6 months   

## 2016-11-13 ENCOUNTER — Other Ambulatory Visit (INDEPENDENT_AMBULATORY_CARE_PROVIDER_SITE_OTHER): Payer: Commercial Managed Care - PPO

## 2016-11-13 ENCOUNTER — Encounter: Payer: Self-pay | Admitting: Internal Medicine

## 2016-11-13 ENCOUNTER — Ambulatory Visit (INDEPENDENT_AMBULATORY_CARE_PROVIDER_SITE_OTHER): Payer: Commercial Managed Care - PPO | Admitting: Internal Medicine

## 2016-11-13 VITALS — BP 128/80 | HR 72 | Temp 98.2°F | Resp 16 | Wt 215.0 lb

## 2016-11-13 DIAGNOSIS — E010 Iodine-deficiency related diffuse (endemic) goiter: Secondary | ICD-10-CM

## 2016-11-13 DIAGNOSIS — R946 Abnormal results of thyroid function studies: Secondary | ICD-10-CM | POA: Diagnosis not present

## 2016-11-13 DIAGNOSIS — I10 Essential (primary) hypertension: Secondary | ICD-10-CM

## 2016-11-13 DIAGNOSIS — R7989 Other specified abnormal findings of blood chemistry: Secondary | ICD-10-CM | POA: Insufficient documentation

## 2016-11-13 LAB — TSH: TSH: 0.22 u[IU]/mL — ABNORMAL LOW (ref 0.35–4.50)

## 2016-11-13 LAB — T4, FREE: FREE T4: 0.9 ng/dL (ref 0.60–1.60)

## 2016-11-13 NOTE — Assessment & Plan Note (Signed)
Tsh, thyroid Ab, FT4 today

## 2016-11-13 NOTE — Assessment & Plan Note (Signed)
Check blood work Had ordered an [place] previously - it was not done Will order depending on labs

## 2016-11-17 ENCOUNTER — Other Ambulatory Visit: Payer: Self-pay | Admitting: Internal Medicine

## 2016-11-17 DIAGNOSIS — E0590 Thyrotoxicosis, unspecified without thyrotoxic crisis or storm: Secondary | ICD-10-CM

## 2016-11-17 DIAGNOSIS — E010 Iodine-deficiency related diffuse (endemic) goiter: Secondary | ICD-10-CM

## 2016-11-17 LAB — THYROID ANTIBODIES
Thyroglobulin Ab: <1 IU/mL (ref <2)
Thyroperoxidase Ab SerPl-aCnc: 1 IU/mL (ref <9)

## 2016-12-14 ENCOUNTER — Ambulatory Visit
Admission: RE | Admit: 2016-12-14 | Discharge: 2016-12-14 | Disposition: A | Discharge status: Home / Self Care | Payer: Commercial Managed Care - PPO | Source: Ambulatory Visit | Attending: Internal Medicine | Admitting: Internal Medicine

## 2016-12-14 DIAGNOSIS — E042 Nontoxic multinodular goiter: Secondary | ICD-10-CM | POA: Diagnosis not present

## 2016-12-14 DIAGNOSIS — E010 Iodine-deficiency related diffuse (endemic) goiter: Secondary | ICD-10-CM

## 2016-12-14 DIAGNOSIS — E0590 Thyrotoxicosis, unspecified without thyrotoxic crisis or storm: Secondary | ICD-10-CM

## 2016-12-31 ENCOUNTER — Ambulatory Visit (INDEPENDENT_AMBULATORY_CARE_PROVIDER_SITE_OTHER): Payer: Commercial Managed Care - PPO | Admitting: Endocrinology

## 2016-12-31 ENCOUNTER — Encounter: Payer: Self-pay | Admitting: Endocrinology

## 2016-12-31 VITALS — BP 140/88 | HR 72 | Ht 68.5 in | Wt 214.0 lb

## 2016-12-31 DIAGNOSIS — R946 Abnormal results of thyroid function studies: Secondary | ICD-10-CM | POA: Diagnosis not present

## 2016-12-31 DIAGNOSIS — E042 Nontoxic multinodular goiter: Secondary | ICD-10-CM

## 2016-12-31 DIAGNOSIS — R7989 Other specified abnormal findings of blood chemistry: Secondary | ICD-10-CM
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
## 2017-01-01 ENCOUNTER — Encounter: Payer: Self-pay | Admitting: Endocrinology

## 2017-01-14 ENCOUNTER — Encounter (HOSPITAL_COMMUNITY)
Admission: RE | Admit: 2017-01-14 | Discharge: 2017-01-14 | Disposition: A | Discharge status: Home / Self Care | Payer: Commercial Managed Care - PPO | Source: Ambulatory Visit | Attending: Endocrinology | Admitting: Endocrinology

## 2017-01-14 ENCOUNTER — Encounter (HOSPITAL_COMMUNITY): Payer: Commercial Managed Care - PPO

## 2017-01-14 DIAGNOSIS — E042 Nontoxic multinodular goiter: Secondary | ICD-10-CM | POA: Diagnosis not present

## 2017-01-14 MED ORDER — SODIUM PERTECHNETATE TC 99M INJECTION
10.0000 | Freq: Once | INTRAVENOUS | Status: AC | PRN
  Administered 2017-01-14: 10 via INTRAVENOUS

## 2017-01-29 DIAGNOSIS — N921 Excessive and frequent menstruation with irregular cycle: Secondary | ICD-10-CM | POA: Diagnosis not present

## 2017-03-02 ENCOUNTER — Other Ambulatory Visit: Payer: Self-pay | Admitting: Internal Medicine

## 2017-05-03 ENCOUNTER — Ambulatory Visit: Payer: Commercial Managed Care - PPO | Admitting: Endocrinology

## 2017-05-17 NOTE — Assessment & Plan Note (Signed)
Following with Dr Kumar 
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
## 2017-05-19 ENCOUNTER — Encounter: Payer: Commercial Managed Care - PPO | Admitting: Internal Medicine

## 2017-05-27 NOTE — Patient Instructions (Addendum)
  Test(s) ordered today. Your results will be released to [place] (or called to you) after review, usually within 72hours after test completion. If any changes need to be made, you will be notified at that same time.  All other Health Maintenance issues reviewed.   All recommended immunizations and age-appropriate screenings are up-to-date or discussed.  No immunizations administered today.   Medications reviewed and updated.  Changes include trying to cut down on amlodipine -- ideal BP 130/80 or less, but has to be less than 140/90.   Please followup in 6 months
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
## 2017-05-28 ENCOUNTER — Other Ambulatory Visit (INDEPENDENT_AMBULATORY_CARE_PROVIDER_SITE_OTHER): Payer: Commercial Managed Care - PPO

## 2017-05-28 ENCOUNTER — Ambulatory Visit (INDEPENDENT_AMBULATORY_CARE_PROVIDER_SITE_OTHER): Payer: Commercial Managed Care - PPO | Admitting: Internal Medicine

## 2017-05-28 ENCOUNTER — Encounter: Payer: Self-pay | Admitting: Internal Medicine

## 2017-05-28 VITALS — BP 122/80 | HR 76 | Temp 98.4°F | Resp 16 | Wt 202.0 lb

## 2017-05-28 DIAGNOSIS — I10 Essential (primary) hypertension: Secondary | ICD-10-CM

## 2017-05-28 DIAGNOSIS — R7989 Other specified abnormal findings of blood chemistry: Secondary | ICD-10-CM

## 2017-05-28 DIAGNOSIS — E010 Iodine-deficiency related diffuse (endemic) goiter: Secondary | ICD-10-CM

## 2017-05-28 LAB — COMPREHENSIVE METABOLIC PANEL
ALT: 12 U/L (ref 0–35)
AST: 17 U/L (ref 0–37)
Albumin: 4.3 g/dL (ref 3.5–5.2)
Alkaline Phosphatase: 68 U/L (ref 39–117)
BUN: 18 mg/dL (ref 6–23)
CALCIUM: 9.4 mg/dL (ref 8.4–10.5)
CHLORIDE: 102 meq/L (ref 96–112)
CO2: 30 meq/L (ref 19–32)
Creatinine, Ser: 0.83 mg/dL (ref 0.40–1.20)
GFR: 98.58 mL/min (ref >60.00)
Glucose, Bld: 81 mg/dL (ref 70–99)
POTASSIUM: 3.5 meq/L (ref 3.5–5.1)
Sodium: 138 mEq/L (ref 135–145)
Total Bilirubin: 0.7 mg/dL (ref 0.2–1.2)
Total Protein: 8.1 g/dL (ref 6.0–8.3)

## 2017-05-28 LAB — T3, FREE: T3, Free: 3.7 pg/mL (ref 2.3–4.2)

## 2017-05-28 LAB — T4, FREE: FREE T4: 0.92 ng/dL (ref 0.60–1.60)

## 2017-05-28 LAB — TSH: TSH: 0.26 u[IU]/mL — AB (ref 0.35–4.50)

## 2017-05-28 NOTE — Assessment & Plan Note (Signed)
Thyromegaly stable

## 2017-05-28 NOTE — Assessment & Plan Note (Signed)
BP well controlled She can try to decrease amlodipine to 2.5 mg daily but needs to monitor BP closely -- if still at goal she can continue lower dose and try to stop altogether, but stressed importance of keeping BP well controlled Continue low sodium diet, regular exercise cmp today

## 2017-05-28 NOTE — Assessment & Plan Note (Signed)
Tsh, tf3, tf4

## 2017-06-08 ENCOUNTER — Other Ambulatory Visit: Payer: Self-pay | Admitting: Endocrinology

## 2017-06-08 DIAGNOSIS — E042 Nontoxic multinodular goiter: Secondary | ICD-10-CM

## 2017-07-08 ENCOUNTER — Telehealth: Payer: Self-pay | Admitting: Endocrinology

## 2017-07-08 NOTE — Telephone Encounter (Signed)
Patient stated she has a referral to get an ultrasound, but she do not know why, she has an appointment Feb 4th, and want to know why before she come to this appt. Please advise

## 2017-07-09 NOTE — Telephone Encounter (Signed)
This is for follow-up of the nodule needs to be done

## 2017-07-12 ENCOUNTER — Ambulatory Visit: Payer: Commercial Managed Care - PPO | Admitting: Endocrinology

## 2017-07-12 DIAGNOSIS — Z0289 Encounter for other administrative examinations: Secondary | ICD-10-CM

## 2017-07-12 NOTE — Telephone Encounter (Signed)
She needs to schedule her thyroid ultrasound and then see me in follow-up, she missed her appointment today

## 2017-07-13 NOTE — Telephone Encounter (Signed)
Called patient and made her aware she will be getting call about referral and she will need to schedule with [place] once she has had ultrasound

## 2017-07-15 ENCOUNTER — Other Ambulatory Visit: Payer: Self-pay | Admitting: Endocrinology

## 2017-07-15 DIAGNOSIS — E042 Nontoxic multinodular goiter: Secondary | ICD-10-CM

## 2017-07-22 ENCOUNTER — Ambulatory Visit
Admission: RE | Admit: 2017-07-22 | Discharge: 2017-07-22 | Disposition: A | Discharge status: Home / Self Care | Payer: Commercial Managed Care - PPO | Source: Ambulatory Visit | Attending: Endocrinology | Admitting: Endocrinology

## 2017-07-22 DIAGNOSIS — E042 Nontoxic multinodular goiter: Secondary | ICD-10-CM | POA: Diagnosis not present

## 2017-07-26 ENCOUNTER — Other Ambulatory Visit: Payer: Self-pay | Admitting: Emergency Medicine

## 2017-07-26 MED ORDER — AMLODIPINE BESYLATE 5 MG PO TABS: 5.0000 mg | ORAL_TABLET | Freq: Every day | ORAL | 0 refills | Status: DC
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
## 2017-08-03 ENCOUNTER — Encounter: Payer: Self-pay | Admitting: Endocrinology

## 2017-08-03 ENCOUNTER — Ambulatory Visit (INDEPENDENT_AMBULATORY_CARE_PROVIDER_SITE_OTHER): Payer: Commercial Managed Care - PPO | Admitting: Endocrinology

## 2017-08-03 VITALS — BP 130/90 | HR 75 | Ht 68.5 in | Wt 207.4 lb

## 2017-08-03 DIAGNOSIS — E042 Nontoxic multinodular goiter: Secondary | ICD-10-CM

## 2017-10-15 ENCOUNTER — Other Ambulatory Visit: Payer: Self-pay | Admitting: Internal Medicine

## 2017-10-25 DIAGNOSIS — H33321 Round hole, right eye: Secondary | ICD-10-CM | POA: Diagnosis not present

## 2017-10-26 DIAGNOSIS — H43813 Vitreous degeneration, bilateral: Secondary | ICD-10-CM | POA: Diagnosis not present

## 2017-10-26 DIAGNOSIS — H3321 Serous retinal detachment, right eye: Secondary | ICD-10-CM | POA: Diagnosis not present

## 2017-11-02 DIAGNOSIS — H35342 Macular cyst, hole, or pseudohole, left eye: Secondary | ICD-10-CM | POA: Diagnosis not present

## 2017-11-05 ENCOUNTER — Other Ambulatory Visit: Payer: Self-pay | Admitting: Internal Medicine

## 2017-11-16 DIAGNOSIS — H35342 Macular cyst, hole, or pseudohole, left eye: Secondary | ICD-10-CM | POA: Diagnosis not present

## 2017-11-28 NOTE — Patient Instructions (Addendum)
  Test(s) ordered today. Your results will be released to MyChart (or called to you) after review, usually within 72hours after test completion. If any changes need to be made, you will be notified at that same time.  Medications reviewed and updated.  No changes recommended at this time.    Please followup in 6 months   
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
## 2017-11-29 ENCOUNTER — Other Ambulatory Visit (INDEPENDENT_AMBULATORY_CARE_PROVIDER_SITE_OTHER): Payer: Commercial Managed Care - PPO

## 2017-11-29 ENCOUNTER — Ambulatory Visit (INDEPENDENT_AMBULATORY_CARE_PROVIDER_SITE_OTHER): Payer: Commercial Managed Care - PPO | Admitting: Internal Medicine

## 2017-11-29 ENCOUNTER — Encounter: Payer: Self-pay | Admitting: Internal Medicine

## 2017-11-29 VITALS — BP 102/70 | HR 80 | Temp 98.4°F | Resp 16 | Wt 209.0 lb

## 2017-11-29 DIAGNOSIS — R5383 Other fatigue: Secondary | ICD-10-CM

## 2017-11-29 DIAGNOSIS — I10 Essential (primary) hypertension: Secondary | ICD-10-CM

## 2017-11-29 LAB — COMPREHENSIVE METABOLIC PANEL
ALT: 13 U/L (ref 0–35)
AST: 16 U/L (ref 0–37)
Albumin: 4.1 g/dL (ref 3.5–5.2)
Alkaline Phosphatase: 58 U/L (ref 39–117)
BILIRUBIN TOTAL: 0.4 mg/dL (ref 0.2–1.2)
BUN: 14 mg/dL (ref 6–23)
CALCIUM: 9.6 mg/dL (ref 8.4–10.5)
CHLORIDE: 102 meq/L (ref 96–112)
CO2: 29 meq/L (ref 19–32)
CREATININE: 0.75 mg/dL (ref 0.40–1.20)
GFR: 110.52 mL/min (ref >60.00)
GLUCOSE: 92 mg/dL (ref 70–99)
Potassium: 3.5 mEq/L (ref 3.5–5.1)
SODIUM: 139 meq/L (ref 135–145)
Total Protein: 7.7 g/dL (ref 6.0–8.3)

## 2017-11-29 LAB — CBC WITH DIFFERENTIAL/PLATELET
BASOS ABS: 0 10*3/uL (ref 0.0–0.1)
BASOS PCT: 0.9 % (ref 0.0–3.0)
EOS ABS: 0.2 10*3/uL (ref 0.0–0.7)
Eosinophils Relative: 3.9 % (ref 0.0–5.0)
HEMATOCRIT: 37.3 % (ref 36.0–46.0)
Hemoglobin: 12.9 g/dL (ref 12.0–15.0)
LYMPHS ABS: 2.1 10*3/uL (ref 0.7–4.0)
LYMPHS PCT: 39.8 % (ref 12.0–46.0)
MCHC: 34.6 g/dL (ref 30.0–36.0)
MCV: 92.7 fl (ref 78.0–100.0)
MONO ABS: 0.5 10*3/uL (ref 0.1–1.0)
Monocytes Relative: 10.1 % (ref 3.0–12.0)
NEUTROS ABS: 2.4 10*3/uL (ref 1.4–7.7)
NEUTROS PCT: 45.3 % (ref 43.0–77.0)
PLATELETS: 270 10*3/uL (ref 150.0–400.0)
RBC: 4.02 Mil/uL (ref 3.87–5.11)
RDW: 12.2 % (ref 11.5–15.5)
WBC: 5.2 10*3/uL (ref 4.0–10.5)

## 2017-11-29 NOTE — Assessment & Plan Note (Signed)
BP well controlled Current regimen effective and well tolerated Continue current medications at current doses cmp  

## 2017-11-29 NOTE — Assessment & Plan Note (Signed)
Likely from not exercising - she will get back into regular exercise Sleep is good No concerning symptoms Has endo f/u in 2 months - unlikely thyroid related Cmp, cbc

## 2018-01-12 ENCOUNTER — Telehealth: Payer: Self-pay | Admitting: Emergency Medicine

## 2018-01-12 NOTE — Telephone Encounter (Signed)
Tried contacting pt. Unable to LVM due to full mail back.

## 2018-01-12 NOTE — Telephone Encounter (Signed)
Copied from [place] [phone]. Topic: General - Other >> Jan 12, 2018  9:11 AM Pricilla Handler wrote: Reason for CRM: Patient called stating that she needs a form completed by Dr. Quay Burow. Patient will fax the document to the office.       Thank You!!!

## 2018-01-13 ENCOUNTER — Other Ambulatory Visit: Payer: Self-pay | Admitting: Internal Medicine

## 2018-01-20 NOTE — Telephone Encounter (Signed)
Spoke with pt to inform that her last CPE was 10/2016. Appt schedule with DR Quay Burow on 02/08/18. Pt requested early morning appt. Insurance form given to [employer].

## 2018-01-26 ENCOUNTER — Other Ambulatory Visit (INDEPENDENT_AMBULATORY_CARE_PROVIDER_SITE_OTHER): Payer: Commercial Managed Care - PPO

## 2018-01-26 DIAGNOSIS — E042 Nontoxic multinodular goiter: Secondary | ICD-10-CM | POA: Diagnosis not present

## 2018-01-26 LAB — T4, FREE: Free T4: 0.81 ng/dL (ref 0.60–1.60)

## 2018-01-26 LAB — TSH: TSH: 0.31 u[IU]/mL — ABNORMAL LOW (ref 0.35–4.50)

## 2018-01-26 LAB — T3, FREE: T3 FREE: 3.8 pg/mL (ref 2.3–4.2)

## 2018-01-27 ENCOUNTER — Other Ambulatory Visit: Payer: Commercial Managed Care - PPO
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
## 2018-02-01 ENCOUNTER — Encounter: Payer: Self-pay | Admitting: Endocrinology

## 2018-02-01 ENCOUNTER — Ambulatory Visit (INDEPENDENT_AMBULATORY_CARE_PROVIDER_SITE_OTHER): Payer: Commercial Managed Care - PPO | Admitting: Endocrinology

## 2018-02-01 VITALS — BP 142/78 | HR 78 | Ht 69.0 in | Wt 213.6 lb

## 2018-02-01 DIAGNOSIS — E042 Nontoxic multinodular goiter: Secondary | ICD-10-CM

## 2018-02-05 ENCOUNTER — Other Ambulatory Visit: Payer: Self-pay | Admitting: Internal Medicine

## 2018-02-06 NOTE — Assessment & Plan Note (Signed)
Monitored by Dr. Kumar 

## 2018-02-06 NOTE — Patient Instructions (Addendum)
Test(s) ordered today. Your results will be released to [facility] (or called to you) after review, usually within 72hours after test completion. If any changes need to be made, you will be notified at that same time.  All other Health Maintenance issues reviewed.   All recommended immunizations and age-appropriate screenings are up-to-date or discussed.  No immunizations administered today.   Medications reviewed and updated.  No changes recommended at this time.   Please followup in 6 months   Health Maintenance, Female Adopting a healthy lifestyle and getting preventive care can go a long way to promote health and wellness. Talk with your health care provider about what schedule of regular examinations is right for you. This is a good chance for you to check in with your provider about disease prevention and staying healthy. In between checkups, there are plenty of things you can do on your own. Experts have done a lot of research about which lifestyle changes and preventive measures are most likely to keep you healthy. Ask your health care provider for more information. Weight and diet Eat a healthy diet  Be sure to include plenty of vegetables, fruits, low-fat dairy products, and lean protein.  Do not eat a lot of foods high in solid fats, added sugars, or salt.  Get regular exercise. This is one of the most important things you can do for your health. ? Most adults should exercise for at least 150 minutes each week. The exercise should increase your heart rate and make you sweat (moderate-intensity exercise). ? Most adults should also do strengthening exercises at least twice a week. This is in addition to the moderate-intensity exercise.  Maintain a healthy weight  Body mass index (BMI) is a measurement that can be used to identify possible weight problems. It estimates body fat based on height and weight. Your health care provider can help determine your BMI and help you achieve or  maintain a healthy weight.  For females 40 years of age and older: ? A BMI below 18.5 is considered underweight. ? A BMI of 18.5 to 24.9 is normal. ? A BMI of 25 to 29.9 is considered overweight. ? A BMI of 30 and above is considered obese.  Watch levels of cholesterol and blood lipids  You should start having your blood tested for lipids and cholesterol at 39 years of age, then have this test every 5 years.  You may need to have your cholesterol levels checked more often if: ? Your lipid or cholesterol levels are high. ? You are older than 39 years of age. ? You are at high risk for heart disease.  Cancer screening Lung Cancer  Lung cancer screening is recommended for adults 59-64 years old who are at high risk for lung cancer because of a history of smoking.  A yearly low-dose CT scan of the lungs is recommended for people who: ? Currently smoke. ? Have quit within the past 15 years. ? Have at least a 30-pack-year history of smoking. A pack year is smoking an average of one pack of cigarettes a day for 1 year.  Yearly screening should continue until it has been 15 years since you quit.  Yearly screening should stop if you develop a health problem that would prevent you from having lung cancer treatment.  Breast Cancer  Practice breast self-awareness. This means understanding how your breasts normally appear and feel.  It also means doing regular breast self-exams. Let your health care provider know about any changes,  no matter how small.  If you are in your 20s or 30s, you should have a clinical breast exam (CBE) by a health care provider every 1-3 years as part of a regular health exam.  If you are 40 or older, have a CBE every year. Also consider having a breast X-ray (mammogram) every year.  If you have a family history of breast cancer, talk to your health care provider about genetic screening.  If you are at high risk for breast cancer, talk to your health care  provider about having an MRI and a mammogram every year.  Breast cancer gene (BRCA) assessment is recommended for women who have family members with BRCA-related cancers. BRCA-related cancers include: ? Breast. ? Ovarian. ? Tubal. ? Peritoneal cancers.  Results of the assessment will determine the need for genetic counseling and BRCA1 and BRCA2 testing.  Cervical Cancer Your health care provider may recommend that you be screened regularly for cancer of the pelvic organs (ovaries, uterus, and vagina). This screening involves a pelvic examination, including checking for microscopic changes to the surface of your cervix (Pap test). You may be encouraged to have this screening done every 3 years, beginning at age 21.  For women ages 30-65, health care providers may recommend pelvic exams and Pap testing every 3 years, or they may recommend the Pap and pelvic exam, combined with testing for human papilloma virus (HPV), every 5 years. Some types of HPV increase your risk of cervical cancer. Testing for HPV may also be done on women of any age with unclear Pap test results.  Other health care providers may not recommend any screening for nonpregnant women who are considered low risk for pelvic cancer and who do not have symptoms. Ask your health care provider if a screening pelvic exam is right for you.  If you have had past treatment for cervical cancer or a condition that could lead to cancer, you need Pap tests and screening for cancer for at least 20 years after your treatment. If Pap tests have been discontinued, your risk factors (such as having a new sexual partner) need to be reassessed to determine if screening should resume. Some women have medical problems that increase the chance of getting cervical cancer. In these cases, your health care provider may recommend more frequent screening and Pap tests.  Colorectal Cancer  This type of cancer can be detected and often prevented.  Routine  colorectal cancer screening usually begins at 39 years of age and continues through 39 years of age.  Your health care provider may recommend screening at an earlier age if you have risk factors for colon cancer.  Your health care provider may also recommend using home test kits to check for hidden blood in the stool.  A small camera at the end of a tube can be used to examine your colon directly (sigmoidoscopy or colonoscopy). This is done to check for the earliest forms of colorectal cancer.  Routine screening usually begins at age 50.  Direct examination of the colon should be repeated every 5-10 years through 39 years of age. However, you may need to be screened more often if early forms of precancerous polyps or small growths are found.  Skin Cancer  Check your skin from head to toe regularly.  Tell your health care provider about any new moles or changes in moles, especially if there is a change in a mole's shape or color.  Also tell your health care provider if you   have a mole that is larger than the size of a pencil eraser.  Always use sunscreen. Apply sunscreen liberally and repeatedly throughout the day.  Protect yourself by wearing long sleeves, pants, a wide-brimmed hat, and sunglasses whenever you are outside.  Heart disease, diabetes, and high blood pressure  High blood pressure causes heart disease and increases the risk of stroke. High blood pressure is more likely to develop in: ? People who have blood pressure in the high end of the normal range (130-139/85-89 mm Hg). ? People who are overweight or obese. ? People who are African American.  If you are 24-25 years of age, have your blood pressure checked every 3-5 years. If you are 2 years of age or older, have your blood pressure checked every year. You should have your blood pressure measured twice-once when you are at a hospital or clinic, and once when you are not at a hospital or clinic. Record the average of the  two measurements. To check your blood pressure when you are not at a hospital or clinic, you can use: ? An automated blood pressure machine at a pharmacy. ? A home blood pressure monitor.  If you are between 42 years and 59 years old, ask your health care provider if you should take aspirin to prevent strokes.  Have regular diabetes screenings. This involves taking a blood sample to check your fasting blood sugar level. ? If you are at a normal weight and have a low risk for diabetes, have this test once every three years after 39 years of age. ? If you are overweight and have a high risk for diabetes, consider being tested at a younger age or more often. Preventing infection Hepatitis B  If you have a higher risk for hepatitis B, you should be screened for this virus. You are considered at high risk for hepatitis B if: ? You were born in a country where hepatitis B is common. Ask your health care provider which countries are considered high risk. ? Your parents were born in a high-risk country, and you have not been immunized against hepatitis B (hepatitis B vaccine). ? You have HIV or AIDS. ? You use needles to inject street drugs. ? You live with someone who has hepatitis B. ? You have had sex with someone who has hepatitis B. ? You get hemodialysis treatment. ? You take certain medicines for conditions, including cancer, organ transplantation, and autoimmune conditions.  Hepatitis C  Blood testing is recommended for: ? Everyone born from [birth year] through 1967. ? Anyone with known risk factors for hepatitis C.  Sexually transmitted infections (STIs)  You should be screened for sexually transmitted infections (STIs) including gonorrhea and chlamydia if: ? You are sexually active and are younger than 39 years of age. ? You are older than 39 years of age and your health care provider tells you that you are at risk for this type of infection. ? Your sexual activity has changed since you  were last screened and you are at an increased risk for chlamydia or gonorrhea. Ask your health care provider if you are at risk.  If you do not have HIV, but are at risk, it may be recommended that you take a prescription medicine daily to prevent HIV infection. This is called pre-exposure prophylaxis (PrEP). You are considered at risk if: ? You are sexually active and do not regularly use condoms or know the HIV status of your partner(s). ? You take drugs by injection. ?  You are sexually active with a partner who has HIV.  Talk with your health care provider about whether you are at high risk of being infected with HIV. If you choose to begin PrEP, you should first be tested for HIV. You should then be tested every 3 months for as long as you are taking PrEP. Pregnancy  If you are premenopausal and you may become pregnant, ask your health care provider about preconception counseling.  If you may become pregnant, take 400 to 800 micrograms (mcg) of folic acid every day.  If you want to prevent pregnancy, talk to your health care provider about birth control (contraception). Osteoporosis and menopause  Osteoporosis is a disease in which the bones lose minerals and strength with aging. This can result in serious bone fractures. Your risk for osteoporosis can be identified using a bone density scan.  If you are 65 years of age or older, or if you are at risk for osteoporosis and fractures, ask your health care provider if you should be screened.  Ask your health care provider whether you should take a calcium or vitamin D supplement to lower your risk for osteoporosis.  Menopause may have certain physical symptoms and risks.  Hormone replacement therapy may reduce some of these symptoms and risks. Talk to your health care provider about whether hormone replacement therapy is right for you. Follow these instructions at home:  Schedule regular health, dental, and eye exams.  Stay current  with your immunizations.  Do not use any tobacco products including cigarettes, chewing tobacco, or electronic cigarettes.  If you are pregnant, do not drink alcohol.  If you are breastfeeding, limit how much and how often you drink alcohol.  Limit alcohol intake to no more than 1 drink per day for nonpregnant women. One drink equals 12 ounces of beer, 5 ounces of wine, or 1 ounces of hard liquor.  Do not use street drugs.  Do not share needles.  Ask your health care provider for help if you need support or information about quitting drugs.  Tell your health care provider if you often feel depressed.  Tell your health care provider if you have ever been abused or do not feel safe at home. This information is not intended to replace advice given to you by your health care provider. Make sure you discuss any questions you have with your health care provider. Document Released: 12/08/2010 Document Revised: 10/31/2015 Document Reviewed: 02/26/2015 [employer] Interactive Patient Education  2018 [employer]  
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
## 2018-02-08 ENCOUNTER — Other Ambulatory Visit: Payer: Self-pay | Admitting: Internal Medicine

## 2018-02-08 ENCOUNTER — Ambulatory Visit (INDEPENDENT_AMBULATORY_CARE_PROVIDER_SITE_OTHER): Payer: Commercial Managed Care - PPO | Admitting: Internal Medicine

## 2018-02-08 ENCOUNTER — Encounter: Payer: Self-pay | Admitting: Internal Medicine

## 2018-02-08 ENCOUNTER — Other Ambulatory Visit (INDEPENDENT_AMBULATORY_CARE_PROVIDER_SITE_OTHER): Payer: Commercial Managed Care - PPO

## 2018-02-08 VITALS — BP 124/82 | HR 67 | Temp 98.1°F | Resp 16 | Ht 69.0 in | Wt 213.0 lb

## 2018-02-08 DIAGNOSIS — Z833 Family history of diabetes mellitus: Secondary | ICD-10-CM

## 2018-02-08 DIAGNOSIS — Z0000 Encounter for general adult medical examination without abnormal findings: Secondary | ICD-10-CM

## 2018-02-08 DIAGNOSIS — I10 Essential (primary) hypertension: Secondary | ICD-10-CM

## 2018-02-08 DIAGNOSIS — E010 Iodine-deficiency related diffuse (endemic) goiter: Secondary | ICD-10-CM | POA: Diagnosis not present

## 2018-02-08 DIAGNOSIS — R7989 Other specified abnormal findings of blood chemistry: Secondary | ICD-10-CM | POA: Diagnosis not present

## 2018-02-08 LAB — LIPID PANEL
CHOLESTEROL: 141 mg/dL (ref 0–200)
HDL: 63.5 mg/dL (ref >39.00)
LDL CALC: 68 mg/dL (ref 0–99)
NonHDL: 77.17
TRIGLYCERIDES: 44 mg/dL (ref 0.0–149.0)
Total CHOL/HDL Ratio: 2
VLDL: 8.8 mg/dL (ref 0.0–40.0)

## 2018-02-08 LAB — COMPREHENSIVE METABOLIC PANEL
ALBUMIN: 4.2 g/dL (ref 3.5–5.2)
ALK PHOS: 61 U/L (ref 39–117)
ALT: 13 U/L (ref 0–35)
AST: 16 U/L (ref 0–37)
BILIRUBIN TOTAL: 0.4 mg/dL (ref 0.2–1.2)
BUN: 13 mg/dL (ref 6–23)
CO2: 28 mEq/L (ref 19–32)
Calcium: 9.3 mg/dL (ref 8.4–10.5)
Chloride: 103 mEq/L (ref 96–112)
Creatinine, Ser: 0.84 mg/dL (ref 0.40–1.20)
GFR: 96.87 mL/min (ref >60.00)
Glucose, Bld: 83 mg/dL (ref 70–99)
POTASSIUM: 3.3 meq/L — AB (ref 3.5–5.1)
Sodium: 139 mEq/L (ref 135–145)
TOTAL PROTEIN: 7.9 g/dL (ref 6.0–8.3)

## 2018-02-08 LAB — CBC WITH DIFFERENTIAL/PLATELET
BASOS ABS: 0 10*3/uL (ref 0.0–0.1)
Basophils Relative: 1.2 % (ref 0.0–3.0)
EOS PCT: 3.4 % (ref 0.0–5.0)
Eosinophils Absolute: 0.1 10*3/uL (ref 0.0–0.7)
HCT: 38.9 % (ref 36.0–46.0)
HEMOGLOBIN: 13.3 g/dL (ref 12.0–15.0)
LYMPHS ABS: 1.5 10*3/uL (ref 0.7–4.0)
Lymphocytes Relative: 35.1 % (ref 12.0–46.0)
MCHC: 34.1 g/dL (ref 30.0–36.0)
MCV: 92.6 fl (ref 78.0–100.0)
MONO ABS: 0.5 10*3/uL (ref 0.1–1.0)
MONOS PCT: 11 % (ref 3.0–12.0)
NEUTROS PCT: 49.3 % (ref 43.0–77.0)
Neutro Abs: 2.1 10*3/uL (ref 1.4–7.7)
Platelets: 289 10*3/uL (ref 150.0–400.0)
RBC: 4.2 Mil/uL (ref 3.87–5.11)
RDW: 12.2 % (ref 11.5–15.5)
WBC: 4.2 10*3/uL (ref 4.0–10.5)

## 2018-02-08 LAB — HEMOGLOBIN A1C: Hgb A1c MFr Bld: 5.4 % (ref 4.6–6.5)

## 2018-02-08 MED ORDER — POTASSIUM CHLORIDE CRYS ER 20 MEQ PO TBCR: 20.0000 meq | EXTENDED_RELEASE_TABLET | Freq: Every day | ORAL | 5 refills | Status: DC

## 2018-02-08 NOTE — Assessment & Plan Note (Signed)
BP well controlled Current regimen effective and well tolerated Continue current medications at current doses cmp  

## 2018-02-08 NOTE — Assessment & Plan Note (Signed)
Strong family history of diabetes Will check a1c Exercising, working on weight loss

## 2018-03-22 DIAGNOSIS — H43813 Vitreous degeneration, bilateral: Secondary | ICD-10-CM | POA: Diagnosis not present

## 2018-05-25 ENCOUNTER — Ambulatory Visit: Payer: Commercial Managed Care - PPO | Admitting: Internal Medicine

## 2018-05-26 ENCOUNTER — Ambulatory Visit (INDEPENDENT_AMBULATORY_CARE_PROVIDER_SITE_OTHER): Payer: Commercial Managed Care - PPO | Admitting: Family Medicine

## 2018-05-26 ENCOUNTER — Encounter: Payer: Self-pay | Admitting: Family Medicine

## 2018-05-26 VITALS — BP 132/78 | HR 75 | Temp 98.2°F | Ht 69.0 in | Wt 216.1 lb

## 2018-05-26 DIAGNOSIS — E010 Iodine-deficiency related diffuse (endemic) goiter: Secondary | ICD-10-CM
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
## 2018-05-26 NOTE — Patient Instructions (Signed)
It was a pleasure to meet you today.  The enlargement that was shown in the provided photo does not appear with exam today.  With history of enlarged thyroid and as you are followed by an endocrinology provider, please contact your endocrinologist for further evaluation and follow up.   Goiter  A goiter is an enlarged thyroid gland. The thyroid is located in the lower front of the neck. It makes hormones that affect many body parts and systems, including the system that affects how quickly the body burns fuel for energy (metabolism). Most goiters are painless and are not a cause for concern. Some goiters can affect the way your thyroid makes thyroid hormones. Goiters and conditions that cause goiters can be treated, if necessary. What are the causes? Common causes of this condition include:  Lack (deficiency) of a mineral called iodine. The thyroid gland uses iodine to make thyroid hormones.  Diseases that attack healthy cells in the body (autoimmune diseases) and affect thyroid function, such as Graves' disease or Hashimoto's disease. These diseases may cause the body to produce too much thyroid hormone (hyperthyroidism) or too little of the hormone (hypothyroidism).  Conditions that cause inflammation of the thyroid (thyroiditis).  One or more small growths on the thyroid (nodular goiter). Other causes include:  Medical problems caused by abnormal genes that are passed from parent to child (genetic defects).  Thyroid injury or infection.  Tumors that may or may not be cancerous.  Pregnancy.  Certain medicines.  Exposure to radiation. In some cases, the cause may not be known. What increases the risk? This condition is more likely to develop in:  People who do not get enough iodine in their diet.  People who have a family history of goiter.  Women.  People who are older than age 62.  People who smoke tobacco.  People who have had exposure to radiation. What are the  signs or symptoms? The main symptom of this condition is swelling in the lower, front part of the neck. This swelling can range from a very small bump to a large lump. Other symptoms may include:  A tight feeling in the throat.  A hoarse voice.  Coughing.  Wheezing.  Difficulty swallowing or breathing.  Bulging veins in the neck.  Dizziness. When a goiter is the result of an overactive thyroid (hyperthyroidism), symptoms may also include:  Nervousness or restlessness.  Inability to tolerate heat.  Unexplained weight loss.  Diarrhea.  Change in the texture of hair or skin.  Changes in heartbeat, such as skipped beats, extra beats, or a rapid heart rate.  Loss of menstruation.  Shaky hands.  Increased appetite.  Sleep problems. When a goiter is the result of an underactive thyroid (hypothyroidism), symptoms may also include:  Feeling like you have no energy (lethargy).  Inability to tolerate cold.  Weight gain that is not explained by a change in diet or exercise habits.  Dry skin.  Coarse hair.  Irregular menstrual periods.  Constipation.  Sadness or depression.  Fatigue. In some cases, there may not be any symptoms and the thyroid hormone levels may be normal. How is this diagnosed? This condition may be diagnosed based on your symptoms, your medical history, and a physical exam. You may have tests, such as:  Blood tests to check thyroid function.  Imaging tests, such as: ? Ultrasound. ? CT scan. ? MRI. ? Thyroid scan.  Removal of a tissue sample (biopsy) of the goiter or any nodules. The sample will  be tested to check for cancer. How is this treated? Treatment for this condition depends on the cause and your symptoms. Treatment may include:  Medicines to regulate thyroid hormone levels.  Anti-inflammatory medicines or steroid medicines, if the goiter is caused by inflammation.  Iodine supplements or changes to your diet, if the goiter is  caused by iodine deficiency.  Radioactive iodine treatment.  Surgery to remove your thyroid. In some cases, you may only need regular check-ups with your health care provider to monitor your condition, and you may not need treatment. Follow these instructions at home:  Follow instructions from your health care provider about any changes to your diet.  Take over-the-counter and prescription medicines only as told by your health care provider. These include supplements.  Do not use any products that contain nicotine or tobacco, such as cigarettes and e-cigarettes. If you need help quitting, ask your health care provider.  Keep all follow-up visits as told by your health care provider. This is important. Contact a health care provider if:  Your symptoms do not get better with treatment.  You have nausea, vomiting, or diarrhea. Get help right away if:  You have sudden, unexplained confusion or other mental changes.  You have a fever.  You have chest pain.  You have trouble breathing or swallowing.  You suddenly become very weak.  You experience extreme restlessness.  You feel your heart racing. Summary  A goiter is an enlarged thyroid gland.  The thyroid gland is located in the lower front of the neck. It makes hormones that affect many body parts and systems, including the system that affects how quickly the body burns fuel for energy (metabolism).  The main symptom of this condition is swelling in the lower, front part of the neck. This swelling can range from a very small bump to a large lump.  Treatment for this condition depends on the cause and your symptoms. You may need medicines, supplements, or regular monitoring of your condition. This information is not intended to replace advice given to you by your health care provider. Make sure you discuss any questions you have with your health care provider. Document Released: 11/12/2009 Document Revised: 02/18/2017 Document  Reviewed: 02/18/2017 Elsevier Interactive Patient Education  [employer].

## 2018-06-03 ENCOUNTER — Ambulatory Visit: Payer: Commercial Managed Care - PPO | Admitting: Internal Medicine

## 2018-06-06 NOTE — Patient Instructions (Addendum)
Medications reviewed and updated.  Changes include :   none    Please followup in 6 months   For weight loss:   Short term medication - phentermine Long term medications - saxenda - daily injection                 The rest are pills - contrave ( wellbutrin/naltrexone), qsymia                                               (topamax/phentermine), belviq   Wellbutrin - mild anti-depressant that can sometimes promote weight loss
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
## 2018-06-07 ENCOUNTER — Encounter: Payer: Self-pay | Admitting: Internal Medicine

## 2018-06-07 ENCOUNTER — Ambulatory Visit (INDEPENDENT_AMBULATORY_CARE_PROVIDER_SITE_OTHER): Payer: Commercial Managed Care - PPO | Admitting: Internal Medicine

## 2018-06-07 DIAGNOSIS — I10 Essential (primary) hypertension: Secondary | ICD-10-CM

## 2018-06-07 DIAGNOSIS — R5383 Other fatigue: Secondary | ICD-10-CM | POA: Diagnosis not present

## 2018-06-07 DIAGNOSIS — Z6831 Body mass index (BMI) 31.0-31.9, adult: Secondary | ICD-10-CM | POA: Diagnosis not present

## 2018-06-07 DIAGNOSIS — E6609 Other obesity due to excess calories: Secondary | ICD-10-CM | POA: Diagnosis not present

## 2018-06-08 NOTE — Assessment & Plan Note (Signed)
She states she still feels tired Has had blood work done in the past 6 months-no cause for fatigue Sleep is good and refreshing Stressed that she needs to get at least 7-8 hours of sleep a night She is exercising-increase exercise Working on weight loss

## 2018-06-08 NOTE — Assessment & Plan Note (Signed)
BP well controlled Current regimen effective and well tolerated Continue current medications at current doses We will hold off on blood work-recheck at next visit

## 2018-06-08 NOTE — Assessment & Plan Note (Addendum)
Mild obesity with comorbidity of hypertension She is going to the gym and will increase how much she is going She is working on eating healthier foods and decreasing her portions Discussed with her the importance of eating less -this is the most important thing for weight loss She would like to consider medication temporarily to help boost her weight loss-reviewed possibilities including phentermine, which it would only be short-term and other long-term medications including Saxenda, Belviq, Contrave, Qsymia Briefly discussed Wellbutrin as well She will look into her insurance coverage and let me know

## 2018-06-09 ENCOUNTER — Telehealth: Payer: Self-pay | Admitting: Endocrinology

## 2018-06-09 NOTE — Telephone Encounter (Signed)
Pt scheduled for 07/04/2018

## 2018-06-09 NOTE — Telephone Encounter (Signed)
Please schedule her follow-up appointment for exam first

## 2018-06-09 NOTE — Telephone Encounter (Signed)
Please advise 

## 2018-06-09 NOTE — Telephone Encounter (Signed)
Patient called stating that her Thyroid is larger.  Patient states that she was advised at her last visit August 2019 that if something changed she should notify office.  Patient also states that her "primary care indicated that she may need an ultra sound of her Thyroid". Patient would like to know if she needs to be seen now, wait until next appointment, or how she should proceed.  Please cal her back at [phone]
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
## 2018-07-04 ENCOUNTER — Encounter: Payer: Self-pay | Admitting: Endocrinology

## 2018-07-04 ENCOUNTER — Ambulatory Visit (INDEPENDENT_AMBULATORY_CARE_PROVIDER_SITE_OTHER): Payer: Commercial Managed Care - PPO | Admitting: Endocrinology

## 2018-07-04 VITALS — BP 142/86 | HR 89 | Ht 69.0 in | Wt 214.8 lb

## 2018-07-04 DIAGNOSIS — E042 Nontoxic multinodular goiter: Secondary | ICD-10-CM | POA: Diagnosis not present

## 2018-07-04 LAB — TSH: TSH: 0.31 u[IU]/mL — ABNORMAL LOW (ref 0.35–4.50)

## 2018-07-04 LAB — T4, FREE: Free T4: 0.94 ng/dL (ref 0.60–1.60)

## 2018-07-05 LAB — T3: T3, Total: 118 ng/dL (ref 71–180)

## 2018-07-07 ENCOUNTER — Other Ambulatory Visit: Payer: Self-pay | Admitting: Internal Medicine

## 2018-08-05 ENCOUNTER — Other Ambulatory Visit: Payer: Self-pay | Admitting: Internal Medicine

## 2018-08-15 ENCOUNTER — Encounter: Payer: Self-pay | Admitting: Internal Medicine

## 2018-08-15 MED ORDER — PHENTERMINE HCL 30 MG PO CAPS: 30.0000 mg | ORAL_CAPSULE | ORAL | 0 refills | Status: DC

## 2018-08-16 DIAGNOSIS — N898 Other specified noninflammatory disorders of vagina: Secondary | ICD-10-CM | POA: Diagnosis not present

## 2018-08-16 DIAGNOSIS — Z6832 Body mass index (BMI) 32.0-32.9, adult: Secondary | ICD-10-CM | POA: Diagnosis not present

## 2018-08-16 DIAGNOSIS — Z01419 Encounter for gynecological examination (general) (routine) without abnormal findings: Secondary | ICD-10-CM | POA: Diagnosis not present

## 2018-08-16 DIAGNOSIS — Z1159 Encounter for screening for other viral diseases: Secondary | ICD-10-CM | POA: Diagnosis not present

## 2018-08-16 DIAGNOSIS — Z118 Encounter for screening for other infectious and parasitic diseases: Secondary | ICD-10-CM | POA: Diagnosis not present

## 2018-08-26 ENCOUNTER — Other Ambulatory Visit: Payer: Self-pay | Admitting: Internal Medicine

## 2018-09-08 ENCOUNTER — Encounter: Payer: Self-pay | Admitting: Internal Medicine

## 2018-09-10 IMAGING — US US THYROID
1 series · 12 of 25 positions shown · non-contrast
Comparison: 11/13/2008

CLINICAL DATA: Thyroid nodules

EXAM:
THYROID ULTRASOUND
TECHNIQUE: Ultrasound examination of the thyroid gland and adjacent soft
tissues was performed.

[Series 1: us thyroid · 0.06mm/px · 12 of 79 slices shown]
[im 4/79]
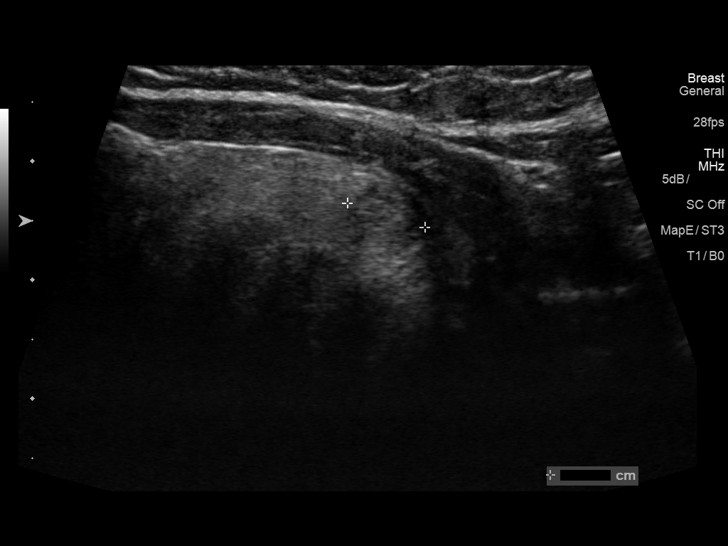
[im 10/79]
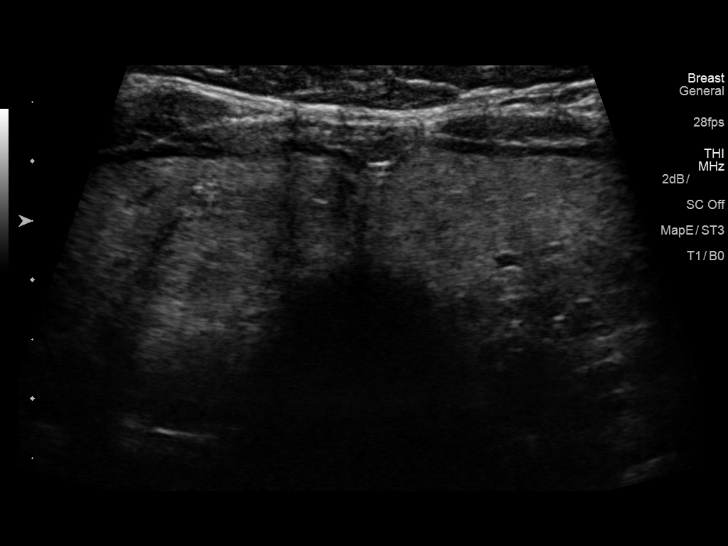
[im 17/79]
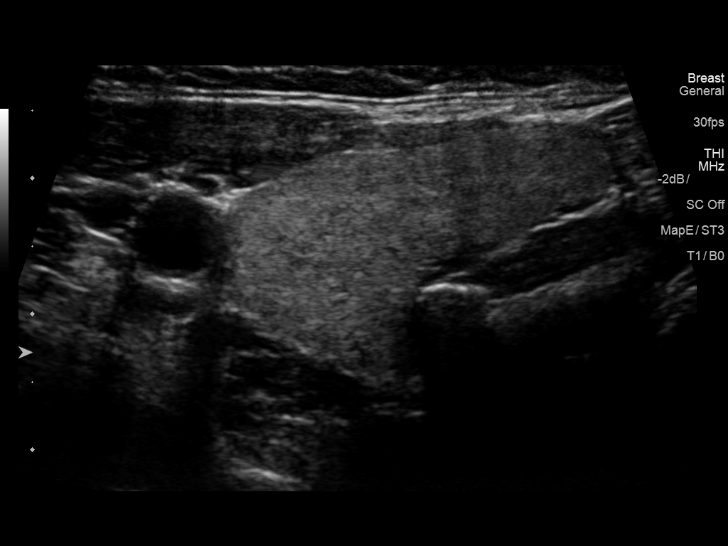
[im 23/79]
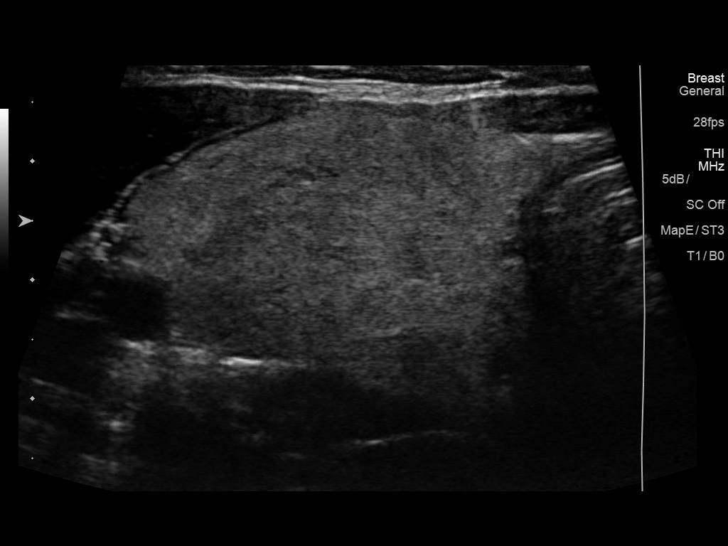
[im 30/79]
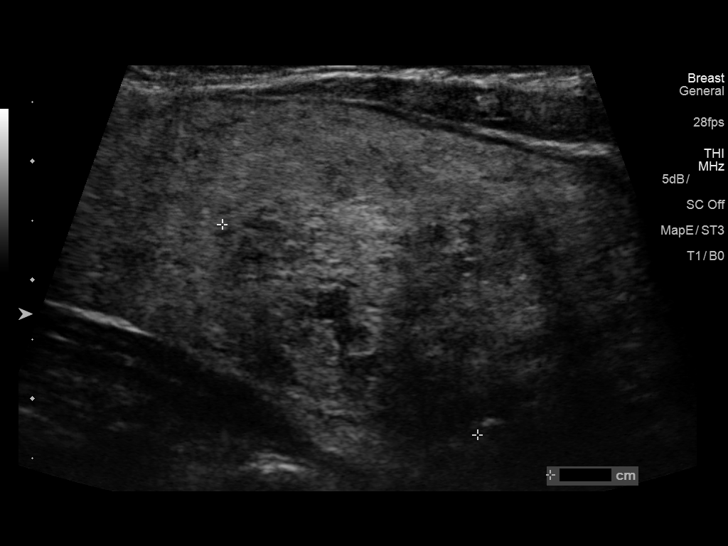
[im 36/79]
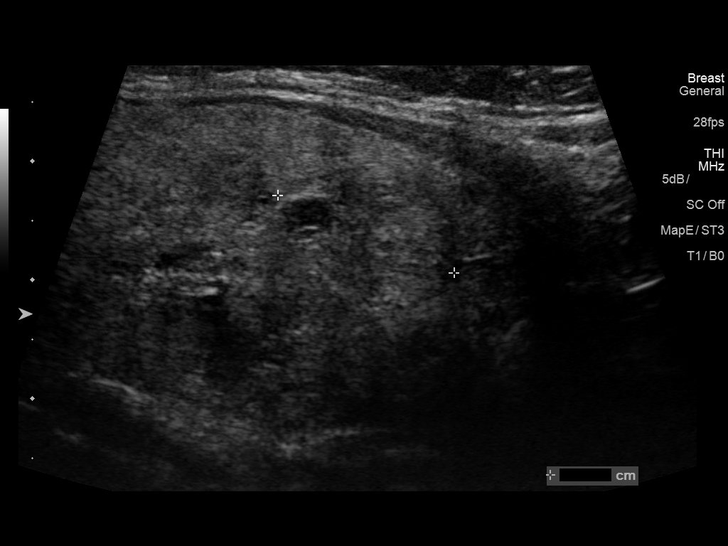
[im 43/79]
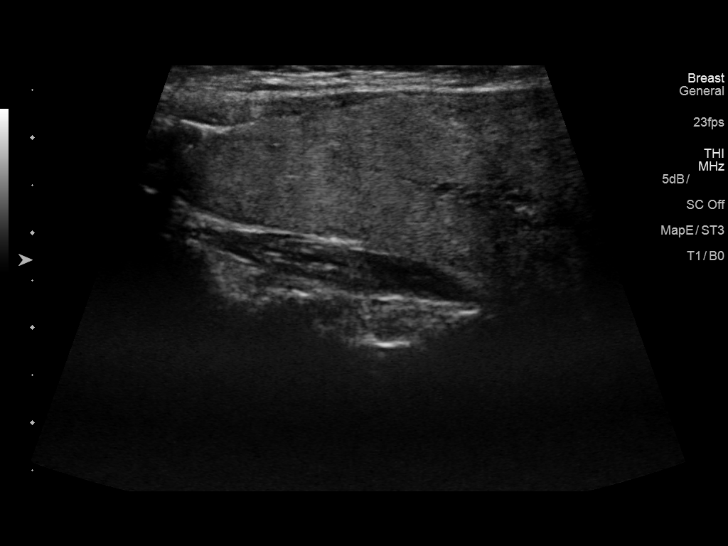
[im 49/79]
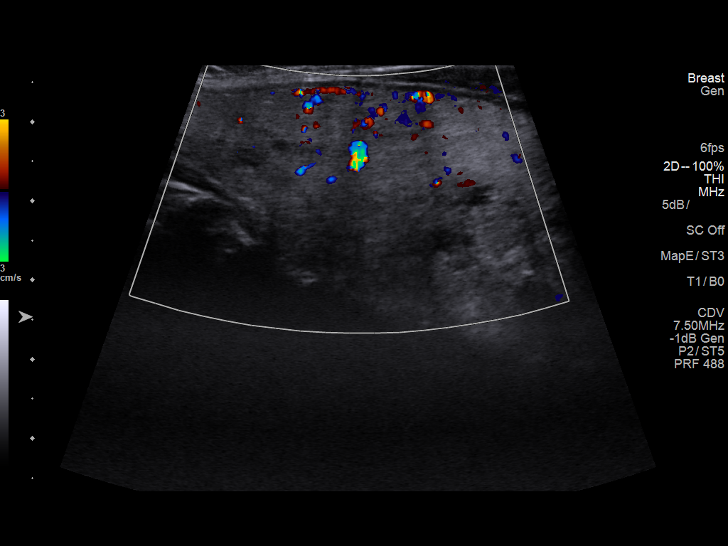
[im 56/79]
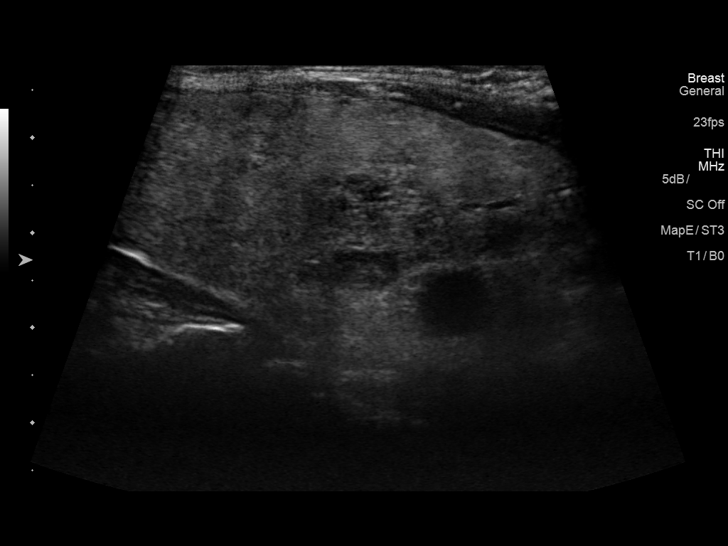
[im 62/79]
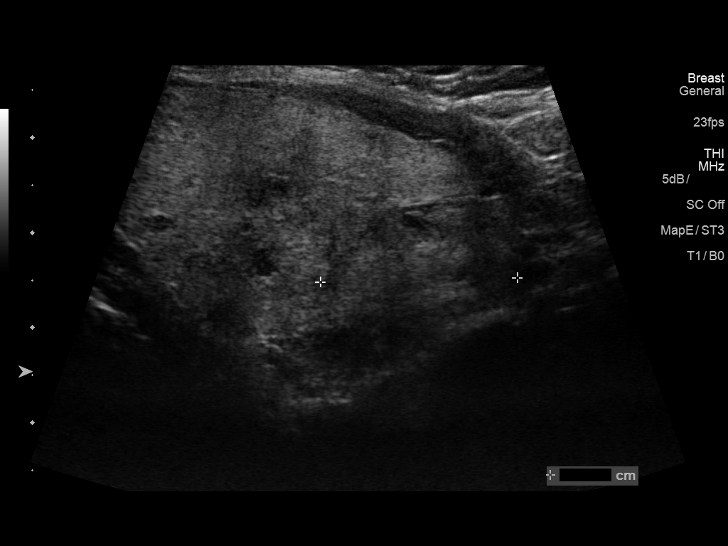
[im 69/79]
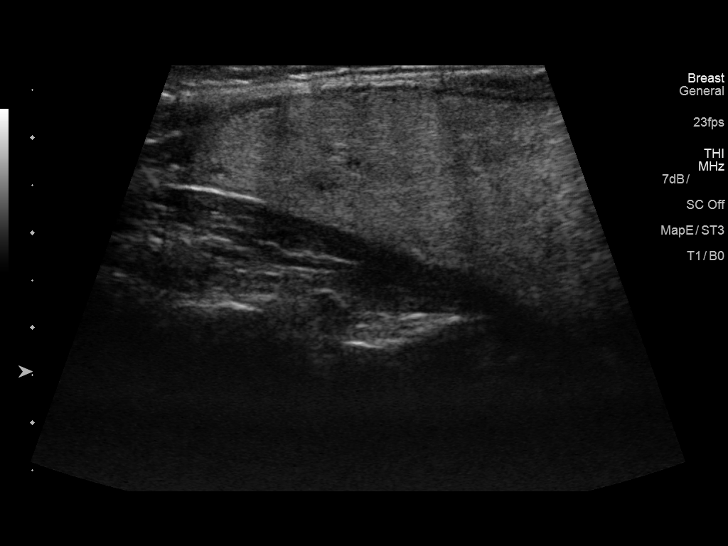
[im 75/79]
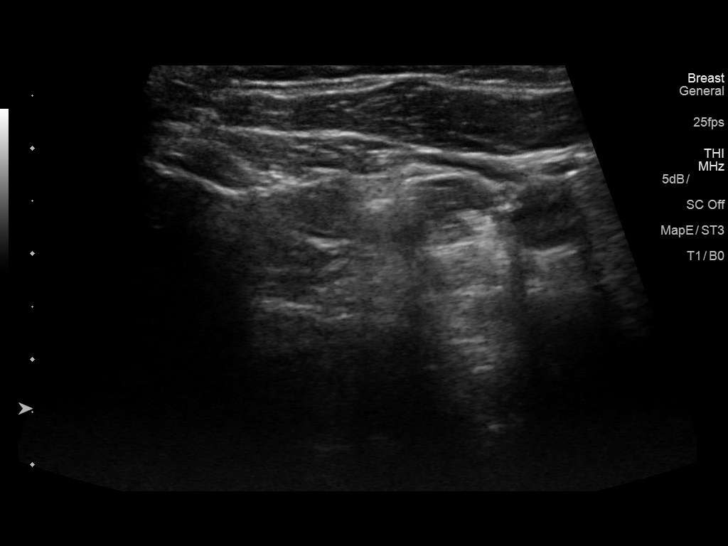

[12 of 25 positions shown; findings below may reference images not displayed]

FINDINGS: Parenchymal Echotexture: Moderately heterogenous

Isthmus: 0.7 cm, previously 0.2 cm

Right lobe: 8.3 x 2.9 x 3.2 cm, previously 6.6 x 1.8 x 2.5 cm

Left lobe: 7.4 x 2.8 x 3.3 cm, previously 6.7 x 1.7 x 2.2 cm

_________________________________________________________

Estimated total number of nodules >/= 1 cm: 5

Number of spongiform nodules >/=  2 cm not described below (TR1): 0

Number of mixed cystic and solid nodules >/= 1.5 cm not described
below (TR2): 0

_________________________________________________________

Nodule # 1, labeled 2 on the worksheet:

Location: Right; Superior

Maximum size: 2.0 cm; Other 2 dimensions: 1.6 x 0.6 cm

Composition: solid/almost completely solid (2)

Echogenicity: isoechoic (1)

Shape: not taller-than-wide (0)

Margins: ill-defined (0)

Echogenic foci: none (0)

ACR TI-RADS total points: 3.

ACR TI-RADS risk category: TR3 (3 points).

ACR TI-RADS recommendations:

*Given size (>/= 1.5 - 2.4 cm) and appearance, a follow-up
ultrasound in 1 year should be considered based on TI-RADS criteria.

_________________________________________________________

Nodule # 2, labeled 4 on the worksheet:

Location: Right; Inferior

Maximum size: 2.8, previously 0.7 cm; Other 2 dimensions: 2.8 x 1.8,
previously 0.7 x 0.6 cm

Composition: solid/almost completely solid (2)

Echogenicity: isoechoic (1)

Shape: not taller-than-wide (0)

Margins: ill-defined (0)

Echogenic foci: none (0)

ACR TI-RADS total points: 3.

ACR TI-RADS risk category: TR3 (3 points).

ACR TI-RADS recommendations:

**Given size (>/= 2.5 cm) and appearance, fine needle aspiration of
this mildly suspicious nodule should be considered based on TI-RADS
criteria.

_________________________________________________________

Nodule # 3, labeled 6 on the worksheet:

Location: Left; Mid

Maximum size: 1.7, previously 0.6 cm; Other 2 dimensions: 1.6 x 1.2,
previously 0.6 x 0.4 cm

Composition: mixed cystic and solid (1)

Echogenicity: isoechoic (1)

Shape: not taller-than-wide (0)

Margins: ill-defined (0)

Echogenic foci: none (0)

ACR TI-RADS total points: 2.

ACR TI-RADS risk category: TR2 (2 points).

ACR TI-RADS recommendations:

This nodule does NOT meet TI-RADS criteria for biopsy or dedicated
follow-up.

_________________________________________________________

Nodule # 4, labeled 7 on the worksheet:

Location: Left; Inferior

Maximum size: 2.3, previously 0.9 cm; Other 2 dimensions: 2.0 x 1.9,
previously 0.8 x 0.7 cm

Composition: solid/almost completely solid (2)

Echogenicity: isoechoic (1)

Shape: not taller-than-wide (0)

Margins: ill-defined (0)

Echogenic foci: none (0)

ACR TI-RADS total points: 3.

ACR TI-RADS risk category: TR3 (3 points).

ACR TI-RADS recommendations:

*Given size (>/= 1.5 - 2.4 cm) and appearance, a follow-up
ultrasound in 1 year should be considered based on TI-RADS criteria.

_________________________________________________________

There are additional isthmus and right thyroid nodules which measure
1.6 cm or less in size and do not meet criteria for additional
imaging or follow-up.
IMPRESSION: Dominant 2.8 cm right inferior solid isoechoic TR 3 nodule meets
criteria for follow-up as above.

Additional TR 3 nodules described above meet criteria for follow-up
in 1 year.

The above is in keeping with the ACR TI-RADS recommendations - [HOSPITAL] 2034;[DATE].

## 2018-09-12 ENCOUNTER — Ambulatory Visit (INDEPENDENT_AMBULATORY_CARE_PROVIDER_SITE_OTHER): Payer: Commercial Managed Care - PPO | Admitting: Internal Medicine

## 2018-09-12 ENCOUNTER — Encounter: Payer: Self-pay | Admitting: Internal Medicine

## 2018-09-12 ENCOUNTER — Ambulatory Visit: Payer: Commercial Managed Care - PPO | Admitting: Internal Medicine

## 2018-09-12 DIAGNOSIS — I10 Essential (primary) hypertension: Secondary | ICD-10-CM | POA: Diagnosis not present

## 2018-09-12 DIAGNOSIS — Z6831 Body mass index (BMI) 31.0-31.9, adult: Secondary | ICD-10-CM | POA: Diagnosis not present

## 2018-09-12 DIAGNOSIS — E6609 Other obesity due to excess calories: Secondary | ICD-10-CM | POA: Diagnosis not present

## 2018-09-12 MED ORDER — PHENTERMINE HCL 37.5 MG PO CAPS: 37.5000 mg | ORAL_CAPSULE | ORAL | 0 refills | Status: DC

## 2018-09-12 NOTE — Assessment & Plan Note (Signed)
Has taken phentermine 30 mg daily for approximately 1 month-it has helped with decrease in her appetite and she has seen some weight loss at home She is interested in trying a higher dose-start 37.5 mg daily-she is hoping she will only need this for month Continue regular exercise-she is walking daily Continue healthy diet and eating less If she does well with this we can continue it for another month after this coming month-she will let me know Monitor blood pressure at home

## 2018-09-12 NOTE — Progress Notes (Signed)
Virtual Visit via Video Note  I connected with Chloe Elliott on 09/12/18 at  1:00 PM EDT by a video enabled telemedicine application and verified that I am speaking with the correct person using two identifiers.   I discussed the limitations of evaluation and management by telemedicine and the availability of in person appointments. The patient expressed understanding and agreed to proceed.  The patient is currently at work and I am in the office.    No referring provider.    History of Present Illness: This visit is for a follow up of her taking phentermine.    Obesity:  She has been taking phentermine daily.  Initially, she did not see much of a difference, but after being on it for a little while she felt like it was decreasing her appetite.  She denies side effects.  She is weighing herself at home.  She is exercising - walking at night.  She is eating healthy, but mostly eating less.   Hypertension: She is taking her medication daily. She is compliant with a low sodium diet.  She denies chest pain, palpitations, edema, shortness of breath and regular headaches.   She has monitored her BP at home and it has been: 08/24/18. 140/87  2nd check: 120/78   3/20 114/66  3/23 116/65  3/24 124/82  3/25 115/68  3/27 117/57  4/2 135/73       Observations/Objective: Appears well in NAD.  BP's as above   Assessment and Plan:  See Problem List for Assessment and Plan of chronic medical problems.   Follow Up Instructions:    I discussed the assessment and treatment plan with the patient. The patient was provided an opportunity to ask questions and all were answered. The patient agreed with the plan and demonstrated an understanding of the instructions.   The patient was advised to call back or seek an in-person evaluation if the symptoms worsen or if the condition fails to improve as anticipated.     Binnie Rail, MD

## 2018-09-12 NOTE — Assessment & Plan Note (Signed)
BP well controlled Current regimen effective and well tolerated Continue current medications at current doses  

## 2018-09-19 ENCOUNTER — Encounter: Payer: Self-pay | Admitting: Internal Medicine

## 2018-09-20 ENCOUNTER — Other Ambulatory Visit: Payer: Self-pay

## 2018-09-20 MED ORDER — AMLODIPINE BESYLATE 5 MG PO TABS: 5.0000 mg | ORAL_TABLET | Freq: Every day | ORAL | 1 refills | Status: DC

## 2018-09-23 ENCOUNTER — Ambulatory Visit: Payer: Commercial Managed Care - PPO | Admitting: Internal Medicine

## 2018-10-05 IMAGING — NM NM THYROID IMAGING ONLY
4 series · 4 of 4 positions shown · non-contrast
Comparison: None.

CLINICAL DATA: Thyroid scan only. Multiple nodule ultrasound. TSH
equal

EXAM:
THYROID SCAN
TECHNIQUE: Following the intravenous administration of radiopharmaceutical, pin
hole collimated images were obtained of the thyroid gland.
RADIOPHARMACEUTICALS:  10.2 mCi Iechnetium-55m pertechnetate IV

[th thyroid scan · 1.14mm/px · 1 of 1 slices shown (1 of 4)]
[im 1/1  full-range]
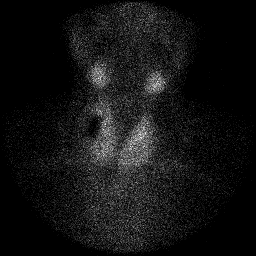

[th thyroid scan · 1.14mm/px · 1 of 1 slices shown (2 of 4)]
[im 1/1  full-range]
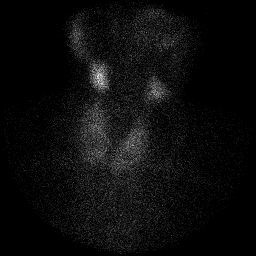

[th thyroid scan · 1.14mm/px · 1 of 1 slices shown (3 of 4)]
[im 1/1  full-range]
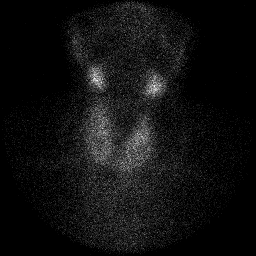

[th thyroid scan · 1.14mm/px · 1 of 1 slices shown (4 of 4)]
[im 1/1  full-range]
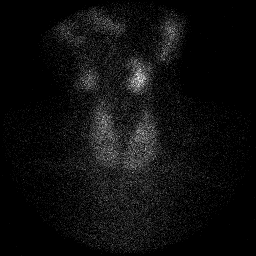

[4 of 4 positions shown; findings below may reference images not displayed]

FINDINGS: Thyroid gland is enlarged. Several ill-defined photopenic nodules
within the mid gland.
IMPRESSION: Multi nodular goiter.

## 2018-12-05 ENCOUNTER — Encounter: Payer: Self-pay | Admitting: Internal Medicine

## 2018-12-05 NOTE — Progress Notes (Signed)
Virtual Visit via Video Note  I connected with Chloe Elliott on 12/06/18 at  3:45 PM EDT by a video enabled telemedicine application and verified that I am speaking with the correct person using two identifiers.   I discussed the limitations of evaluation and management by telemedicine and the availability of in person appointments. The patient expressed understanding and agreed to proceed.  The patient is currently at home and I am in the office.    No referring provider.    History of Present Illness: She is here for follow up of her chronic medical conditions.   She is exercising regularly - 3 days a week and trying to increase it to 4 days.    Hypertension: She is taking her medication daily. She is compliant with a low sodium diet.  She denies chest pain, palpitations, edema, shortness of breath and regular headaches. She does monitor her blood pressure at home.    Obese:  She is seeing a nutritionist.  She is on the phentermine.  She has lost 8 lbs so far.  She is eating better.    Mosquito bites:  She has been putting cortisone cream on it and it helps minimally.  She wondered what else she can use.      Review of Systems  Constitutional: Negative for chills and fever.  Respiratory: Negative for cough, shortness of breath and wheezing.   Cardiovascular: Negative for chest pain, palpitations and leg swelling.  Neurological: Negative for headaches.     Social History   Socioeconomic History  . Marital status: Divorced    Spouse name: Not on file  . Number of children: Not on file  . Years of education: Not on file  . Highest education level: Not on file  Occupational History  . Not on file  Social Needs  . Financial resource strain: Not on file  . Food insecurity    Worry: Not on file    Inability: Not on file  . Transportation needs    Medical: Not on file    Non-medical: Not on file  Tobacco Use  . Smoking status: Never Smoker  . Smokeless tobacco: Never  Used  Substance and Sexual Activity  . Alcohol use: No  . Drug use: No  . Sexual activity: Yes    Birth control/protection: Condom  Lifestyle  . Physical activity    Days per week: Not on file    Minutes per session: Not on file  . Stress: Not on file  Relationships  . Social Herbalist on phone: Not on file    Gets together: Not on file    Attends religious service: Not on file    Active member of club or organization: Not on file    Attends meetings of clubs or organizations: Not on file    Relationship status: Not on file  Other Topics Concern  . Not on file  Social History Narrative  . Not on file     Observations/Objective: Appears well in NAD   Assessment and Plan:  See Problem List for Assessment and Plan of chronic medical problems.   Follow Up Instructions:    I discussed the assessment and treatment plan with the patient. The patient was provided an opportunity to ask questions and all were answered. The patient agreed with the plan and demonstrated an understanding of the instructions.   The patient was advised to call back or seek an in-person evaluation if the symptoms worsen  or if the condition fails to improve as anticipated.    Binnie Rail, MD

## 2018-12-06 ENCOUNTER — Ambulatory Visit (INDEPENDENT_AMBULATORY_CARE_PROVIDER_SITE_OTHER): Payer: Commercial Managed Care - PPO | Admitting: Internal Medicine

## 2018-12-06 ENCOUNTER — Encounter: Payer: Self-pay | Admitting: Internal Medicine

## 2018-12-06 DIAGNOSIS — Z6831 Body mass index (BMI) 31.0-31.9, adult: Secondary | ICD-10-CM | POA: Diagnosis not present

## 2018-12-06 DIAGNOSIS — E6609 Other obesity due to excess calories: Secondary | ICD-10-CM | POA: Diagnosis not present

## 2018-12-06 DIAGNOSIS — I10 Essential (primary) hypertension: Secondary | ICD-10-CM

## 2018-12-06 DIAGNOSIS — W57XXXA Bitten or stung by nonvenomous insect and other nonvenomous arthropods, initial encounter: Secondary | ICD-10-CM | POA: Insufficient documentation

## 2018-12-06 MED ORDER — AMLODIPINE BESYLATE 5 MG PO TABS: 5.0000 mg | ORAL_TABLET | Freq: Every day | ORAL | 1 refills | Status: DC

## 2018-12-06 NOTE — Assessment & Plan Note (Signed)
Following with a nutritionist Taking phentermine Exercising  Has lost 8 lbs  continue

## 2018-12-06 NOTE — Assessment & Plan Note (Signed)
BP Readings from Last 3 Encounters:  07/04/18 (!) 142/86  06/07/18 124/72  05/26/18 132/78   BP well controlled Current regimen effective and well tolerated Continue current medications at current doses

## 2018-12-06 NOTE — Assessment & Plan Note (Signed)
otc cortisone cream not effective Can try benadryl cream or prescription steroid cream -- she will try benadryl cream

## 2019-01-02 ENCOUNTER — Other Ambulatory Visit (INDEPENDENT_AMBULATORY_CARE_PROVIDER_SITE_OTHER): Payer: Commercial Managed Care - PPO

## 2019-01-02 ENCOUNTER — Other Ambulatory Visit: Payer: Self-pay

## 2019-01-02 DIAGNOSIS — E042 Nontoxic multinodular goiter: Secondary | ICD-10-CM

## 2019-01-02 LAB — TSH: TSH: 0.22 u[IU]/mL — ABNORMAL LOW (ref 0.35–4.50)

## 2019-01-02 LAB — T4, FREE: Free T4: 0.78 ng/dL (ref 0.60–1.60)

## 2019-01-02 LAB — T3, FREE: T3, Free: 3.3 pg/mL (ref 2.3–4.2)

## 2019-01-04 ENCOUNTER — Other Ambulatory Visit: Payer: Self-pay

## 2019-01-04 ENCOUNTER — Ambulatory Visit (INDEPENDENT_AMBULATORY_CARE_PROVIDER_SITE_OTHER): Payer: Commercial Managed Care - PPO | Admitting: Endocrinology

## 2019-01-04 VITALS — BP 130/84 | HR 82 | Temp 97.8°F | Ht 69.0 in | Wt 201.2 lb

## 2019-01-04 DIAGNOSIS — E042 Nontoxic multinodular goiter: Secondary | ICD-10-CM
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
## 2019-01-30 ENCOUNTER — Other Ambulatory Visit: Payer: Commercial Managed Care - PPO

## 2019-01-31 ENCOUNTER — Other Ambulatory Visit: Payer: Self-pay | Admitting: Endocrinology

## 2019-01-31 DIAGNOSIS — E042 Nontoxic multinodular goiter: Secondary | ICD-10-CM

## 2019-02-03 ENCOUNTER — Ambulatory Visit: Payer: Commercial Managed Care - PPO | Admitting: Endocrinology

## 2019-02-08 ENCOUNTER — Ambulatory Visit
Admission: RE | Admit: 2019-02-08 | Discharge: 2019-02-08 | Disposition: A | Discharge status: Home / Self Care | Payer: Commercial Managed Care - PPO | Source: Ambulatory Visit | Attending: Endocrinology | Admitting: Endocrinology

## 2019-02-08 DIAGNOSIS — E042 Nontoxic multinodular goiter: Secondary | ICD-10-CM

## 2019-02-12 ENCOUNTER — Other Ambulatory Visit: Payer: Self-pay | Admitting: Internal Medicine

## 2019-03-07 ENCOUNTER — Ambulatory Visit (INDEPENDENT_AMBULATORY_CARE_PROVIDER_SITE_OTHER): Payer: Commercial Managed Care - PPO | Admitting: Endocrinology

## 2019-03-07 ENCOUNTER — Encounter: Payer: Self-pay | Admitting: Endocrinology

## 2019-03-07 ENCOUNTER — Other Ambulatory Visit: Payer: Self-pay

## 2019-03-07 VITALS — BP 104/76 | HR 93 | Temp 98.2°F | Wt 192.8 lb

## 2019-03-07 DIAGNOSIS — M542 Cervicalgia: Secondary | ICD-10-CM

## 2019-03-07 DIAGNOSIS — E042 Nontoxic multinodular goiter: Secondary | ICD-10-CM | POA: Diagnosis not present

## 2019-03-07 MED ORDER — ETODOLAC 400 MG PO TABS: 400.0000 mg | ORAL_TABLET | Freq: Two times a day (BID) | ORAL | 0 refills | Status: DC
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
## 2019-05-23 NOTE — Patient Instructions (Addendum)
Tests ordered today. Your results will be released to [facility] (or called to you) after review.  If any changes need to be made, you will be notified at that same time.  All other Health Maintenance issues reviewed.   All recommended immunizations and age-appropriate screenings are up-to-date or discussed.  No immunization administered today.   Medications reviewed and updated.  Changes include :   Stop lisinopril-hctz and take hctz 25 mg daily and increase amlodipine to 10 mg   Your prescription(s) have been submitted to your pharmacy. Please take as directed and contact our office if you believe you are having problem(s) with the medication(s).  A referral was ordered for sports medicine  Please followup in 6 months    Health Maintenance, Female Adopting a healthy lifestyle and getting preventive care are important in promoting health and wellness. Ask your health care provider about:  The right schedule for you to have regular tests and exams.  Things you can do on your own to prevent diseases and keep yourself healthy. What should I know about diet, weight, and exercise? Eat a healthy diet   Eat a diet that includes plenty of vegetables, fruits, low-fat dairy products, and lean protein.  Do not eat a lot of foods that are high in solid fats, added sugars, or sodium. Maintain a healthy weight Body mass index (BMI) is used to identify weight problems. It estimates body fat based on height and weight. Your health care provider can help determine your BMI and help you achieve or maintain a healthy weight. Get regular exercise Get regular exercise. This is one of the most important things you can do for your health. Most adults should:  Exercise for at least 150 minutes each week. The exercise should increase your heart rate and make you sweat (moderate-intensity exercise).  Do strengthening exercises at least twice a week. This is in addition to the moderate-intensity exercise.   Spend less time sitting. Even light physical activity can be beneficial. Watch cholesterol and blood lipids Have your blood tested for lipids and cholesterol at 40 years of age, then have this test every 5 years. Have your cholesterol levels checked more often if:  Your lipid or cholesterol levels are high.  You are older than 40 years of age.  You are at high risk for heart disease. What should I know about cancer screening? Depending on your health history and family history, you may need to have cancer screening at various ages. This may include screening for:  Breast cancer.  Cervical cancer.  Colorectal cancer.  Skin cancer.  Lung cancer. What should I know about heart disease, diabetes, and high blood pressure? Blood pressure and heart disease  High blood pressure causes heart disease and increases the risk of stroke. This is more likely to develop in people who have high blood pressure readings, are of African descent, or are overweight.  Have your blood pressure checked: ? Every 3-5 years if you are 54-7 years of age. ? Every year if you are 22 years old or older. Diabetes Have regular diabetes screenings. This checks your fasting blood sugar level. Have the screening done:  Once every three years after age 18 if you are at a normal weight and have a low risk for diabetes.  More often and at a younger age if you are overweight or have a high risk for diabetes. What should I know about preventing infection? Hepatitis B If you have a higher risk for hepatitis B,  you should be screened for this virus. Talk with your health care provider to find out if you are at risk for hepatitis B infection. Hepatitis C Testing is recommended for:  Everyone born from [birth year] through 1967.  Anyone with known risk factors for hepatitis C. Sexually transmitted infections (STIs)  Get screened for STIs, including gonorrhea and chlamydia, if: ? You are sexually active and are younger  than 40 years of age. ? You are older than 40 years of age and your health care provider tells you that you are at risk for this type of infection. ? Your sexual activity has changed since you were last screened, and you are at increased risk for chlamydia or gonorrhea. Ask your health care provider if you are at risk.  Ask your health care provider about whether you are at high risk for HIV. Your health care provider may recommend a prescription medicine to help prevent HIV infection. If you choose to take medicine to prevent HIV, you should first get tested for HIV. You should then be tested every 3 months for as long as you are taking the medicine. Pregnancy  If you are about to stop having your period (premenopausal) and you may become pregnant, seek counseling before you get pregnant.  Take 400 to 800 micrograms (mcg) of folic acid every day if you become pregnant.  Ask for birth control (contraception) if you want to prevent pregnancy. Osteoporosis and menopause Osteoporosis is a disease in which the bones lose minerals and strength with aging. This can result in bone fractures. If you are 17 years old or older, or if you are at risk for osteoporosis and fractures, ask your health care provider if you should:  Be screened for bone loss.  Take a calcium or vitamin D supplement to lower your risk of fractures.  Be given hormone replacement therapy (HRT) to treat symptoms of menopause. Follow these instructions at home: Lifestyle  Do not use any products that contain nicotine or tobacco, such as cigarettes, e-cigarettes, and chewing tobacco. If you need help quitting, ask your health care provider.  Do not use street drugs.  Do not share needles.  Ask your health care provider for help if you need support or information about quitting drugs. Alcohol use  Do not drink alcohol if: ? Your health care provider tells you not to drink. ? You are pregnant, may be pregnant, or are  planning to become pregnant.  If you drink alcohol: ? Limit how much you use to 0-1 drink a day. ? Limit intake if you are breastfeeding.  Be aware of how much alcohol is in your drink. In the [place] one drink equals one 12 oz bottle of beer (355 mL), one 5 oz glass of wine (148 mL), or one 1 oz glass of hard liquor (44 mL). General instructions  Schedule regular health, dental, and eye exams.  Stay current with your vaccines.  Tell your health care provider if: ? You often feel depressed. ? You have ever been abused or do not feel safe at home. Summary  Adopting a healthy lifestyle and getting preventive care are important in promoting health and wellness.  Follow your health care provider's instructions about healthy diet, exercising, and getting tested or screened for diseases.  Follow your health care provider's instructions on monitoring your cholesterol and blood pressure. This information is not intended to replace advice given to you by your health care provider. Make sure you discuss any questions you have  with your health care provider. Document Released: 12/08/2010 Document Revised: 05/18/2018 Document Reviewed: 05/18/2018 Elsevier Patient Education  2020 [employer].

## 2019-05-24 ENCOUNTER — Other Ambulatory Visit (INDEPENDENT_AMBULATORY_CARE_PROVIDER_SITE_OTHER): Payer: Commercial Managed Care - PPO

## 2019-05-24 ENCOUNTER — Ambulatory Visit (INDEPENDENT_AMBULATORY_CARE_PROVIDER_SITE_OTHER): Payer: Commercial Managed Care - PPO | Admitting: Internal Medicine

## 2019-05-24 ENCOUNTER — Other Ambulatory Visit: Payer: Self-pay

## 2019-05-24 ENCOUNTER — Encounter: Payer: Self-pay | Admitting: Internal Medicine

## 2019-05-24 VITALS — BP 122/74 | HR 109 | Temp 98.7°F | Resp 16 | Ht 69.0 in | Wt 194.0 lb

## 2019-05-24 DIAGNOSIS — E010 Iodine-deficiency related diffuse (endemic) goiter: Secondary | ICD-10-CM

## 2019-05-24 DIAGNOSIS — Z0000 Encounter for general adult medical examination without abnormal findings: Secondary | ICD-10-CM

## 2019-05-24 DIAGNOSIS — Z6831 Body mass index (BMI) 31.0-31.9, adult: Secondary | ICD-10-CM

## 2019-05-24 DIAGNOSIS — I10 Essential (primary) hypertension: Secondary | ICD-10-CM

## 2019-05-24 DIAGNOSIS — M25551 Pain in right hip: Secondary | ICD-10-CM | POA: Insufficient documentation

## 2019-05-24 DIAGNOSIS — Z833 Family history of diabetes mellitus: Secondary | ICD-10-CM

## 2019-05-24 DIAGNOSIS — M25552 Pain in left hip: Secondary | ICD-10-CM

## 2019-05-24 DIAGNOSIS — E6609 Other obesity due to excess calories: Secondary | ICD-10-CM | POA: Diagnosis not present

## 2019-05-24 DIAGNOSIS — R7989 Other specified abnormal findings of blood chemistry: Secondary | ICD-10-CM

## 2019-05-24 LAB — LIPID PANEL
Cholesterol: 167 mg/dL (ref 0–200)
HDL: 64.1 mg/dL (ref >39.00)
LDL Cholesterol: 87 mg/dL (ref 0–99)
NonHDL: 102.49
Total CHOL/HDL Ratio: 3
Triglycerides: 75 mg/dL (ref 0.0–149.0)
VLDL: 15 mg/dL (ref 0.0–40.0)

## 2019-05-24 LAB — COMPREHENSIVE METABOLIC PANEL
ALT: 12 U/L (ref 0–35)
AST: 14 U/L (ref 0–37)
Albumin: 4.3 g/dL (ref 3.5–5.2)
Alkaline Phosphatase: 59 U/L (ref 39–117)
BUN: 12 mg/dL (ref 6–23)
CO2: 30 mEq/L (ref 19–32)
Calcium: 9.6 mg/dL (ref 8.4–10.5)
Chloride: 103 mEq/L (ref 96–112)
Creatinine, Ser: 0.75 mg/dL (ref 0.40–1.20)
GFR: 103.2 mL/min (ref >60.00)
Glucose, Bld: 77 mg/dL (ref 70–99)
Potassium: 3.3 mEq/L — ABNORMAL LOW (ref 3.5–5.1)
Sodium: 139 mEq/L (ref 135–145)
Total Bilirubin: 0.4 mg/dL (ref 0.2–1.2)
Total Protein: 7.9 g/dL (ref 6.0–8.3)

## 2019-05-24 LAB — CBC WITH DIFFERENTIAL/PLATELET
Basophils Absolute: 0.1 10*3/uL (ref 0.0–0.1)
Basophils Relative: 1.1 % (ref 0.0–3.0)
Eosinophils Absolute: 0.1 10*3/uL (ref 0.0–0.7)
Eosinophils Relative: 2.7 % (ref 0.0–5.0)
HCT: 39.4 % (ref 36.0–46.0)
Hemoglobin: 13.4 g/dL (ref 12.0–15.0)
Lymphocytes Relative: 43.4 % (ref 12.0–46.0)
Lymphs Abs: 2.1 10*3/uL (ref 0.7–4.0)
MCHC: 34 g/dL (ref 30.0–36.0)
MCV: 93.8 fl (ref 78.0–100.0)
Monocytes Absolute: 0.5 10*3/uL (ref 0.1–1.0)
Monocytes Relative: 10.7 % (ref 3.0–12.0)
Neutro Abs: 2.1 10*3/uL (ref 1.4–7.7)
Neutrophils Relative %: 42.1 % — ABNORMAL LOW (ref 43.0–77.0)
Platelets: 295 10*3/uL (ref 150.0–400.0)
RBC: 4.19 Mil/uL (ref 3.87–5.11)
RDW: 12.5 % (ref 11.5–15.5)
WBC: 4.9 10*3/uL (ref 4.0–10.5)

## 2019-05-24 LAB — HEMOGLOBIN A1C: Hgb A1c MFr Bld: 5.1 % (ref 4.6–6.5)

## 2019-05-24 MED ORDER — HYDROCHLOROTHIAZIDE 25 MG PO TABS: 25.0000 mg | ORAL_TABLET | Freq: Every day | ORAL | 3 refills | Status: DC

## 2019-05-24 MED ORDER — AMLODIPINE BESYLATE 10 MG PO TABS: 10.0000 mg | ORAL_TABLET | Freq: Every day | ORAL | 3 refills | Status: DC

## 2019-05-24 NOTE — Assessment & Plan Note (Signed)
Monitored by Dr. Dwyane Dee

## 2019-05-24 NOTE — Assessment & Plan Note (Signed)
Blood pressure well controlled, but her sister did have angioedema secondary to lisinopril and we will go ahead and switch this since there is a slight genetic predisposition for this Increase amlodipine to 10 mg daily Continue hydrochlorothiazide 25 mg daily Stop lisinopril She will monitor her blood pressure-most likely we will need to add a third medication and we can consider Bystolic She will update me via MyChart CMP

## 2019-05-24 NOTE — Assessment & Plan Note (Signed)
A1c

## 2019-05-24 NOTE — Assessment & Plan Note (Signed)
Possibly tendinitis or bursitis Will refer to sports medicine for further evaluation

## 2019-05-31 MED ORDER — POTASSIUM CHLORIDE CRYS ER 20 MEQ PO TBCR: 20.0000 meq | EXTENDED_RELEASE_TABLET | Freq: Two times a day (BID) | ORAL | 1 refills | Status: DC

## 2019-06-13 ENCOUNTER — Encounter: Payer: Self-pay | Admitting: Family Medicine

## 2019-06-13 ENCOUNTER — Other Ambulatory Visit: Payer: Self-pay

## 2019-06-13 ENCOUNTER — Ambulatory Visit (INDEPENDENT_AMBULATORY_CARE_PROVIDER_SITE_OTHER): Payer: Commercial Managed Care - PPO | Admitting: Family Medicine

## 2019-06-13 ENCOUNTER — Ambulatory Visit (INDEPENDENT_AMBULATORY_CARE_PROVIDER_SITE_OTHER): Payer: Commercial Managed Care - PPO

## 2019-06-13 VITALS — BP 124/84 | HR 92 | Ht 69.0 in | Wt 195.2 lb

## 2019-06-13 DIAGNOSIS — M7061 Trochanteric bursitis, right hip: Secondary | ICD-10-CM

## 2019-06-13 DIAGNOSIS — M7062 Trochanteric bursitis, left hip: Secondary | ICD-10-CM

## 2019-06-13 DIAGNOSIS — M25552 Pain in left hip: Secondary | ICD-10-CM

## 2019-06-13 NOTE — Progress Notes (Signed)
No significant abnormalities of the left hip joint.  The junction between the pelvis and low back has evidence of chronic irritation (sacroiliitis).  This would not explain the pain you are having now.  If needed MRI will tell us more.

## 2019-06-13 NOTE — Patient Instructions (Signed)
Thank you for coming in today. Get xray now.  Check back in 1 month.  Attend PT.  I think the pain at the outside of the hip is trochanteric bursitis.   OK to continue over the counter medicine.   Recheck sooner if needed.

## 2019-06-13 NOTE — Progress Notes (Signed)
Subjective:    I'm seeing this patient as a consultation for:  Dr. Quay Burow. Note will be routed back to referring provider/PCP.  CC: B hip pain  I, Molly Weber, LAT, ATC, am serving as scribe for Dr. Lynne Leader.  HPI: Pt is a 39 y/o female presenting w/ c/o B hip pain x 3 years that has worsened over the past 2 months.  She saw her PCP on 05/24/19 and was referred to sports medicine.  Aggravating factors include laying supine, sidelying, taking her first few steps after sitting for a prolonged period of time.  Since her visit w/ her PCP, she reports throbbing, toothache type pain in B hips L>R along her lateral hip.  She reports shooting pain down her lateral thigh.  She states that the pain is worse at night.  Pt rates her pain at a 6/10 that worsens to a 7/10 at night.  She denies any numbness/tingling in her B LEs and denies low back pain.  She has tried some lower body stretching but no other treatments.  Past medical history, Surgical history, Family history not pertinant except as noted below, Social history, Allergies, and medications have been entered into the medical record, reviewed, and no changes needed.   Review of Systems: No headache, visual changes, nausea, vomiting, diarrhea, constipation, dizziness, abdominal pain, skin rash, fevers, chills, night sweats, weight loss, swollen lymph nodes, body aches, joint swelling, muscle aches, chest pain, shortness of breath, mood changes, visual or auditory hallucinations.   Objective:    Vitals:   06/13/19 0913  BP: 124/84  Pulse: 92  SpO2: 97%   General: Well Developed, well nourished, and in no acute distress.  Neuro/Psych: Alert and oriented x3, extra-ocular muscles intact, able to move all 4 extremities, sensation grossly intact. Skin: Warm and dry, no rashes noted.  Respiratory: Not using accessory muscles, speaking in full sentences, trachea midline.  Cardiovascular: Pulses palpable, no extremity edema. Abdomen: Does not  appear distended. MSK:  Right hip: Normal-appearing Normal motion. Tender palpation greater trochanter. Hip abduction strength diminished 4/5.  External rotation internal rotation and adduction strength 5/5. Negative FABER test.  Negative FADIR test.  Left hip: Normal-appearing Motion some pain with internal rotation. Tender palpation greater trochanter. Hip abduction strength diminished 4/5.  External rotation, internal rotation, adduction strength 5/5. Negative FABER test.  Mildly positive FADIR test.  Normal gait.  Lab and Radiology Results  X-ray images left hip obtained today personally and independently reviewed No acute fractures.  No severe degenerative changes. Await formal radiology review  *RADIOLOGY REPORT*   12/21/2011  Clinical Data: Left hip pain and limping.  MRI OF THE LEFT HIP WITHOUT AND WITH CONTRAST  Technique:  Multiplanar, multisequence MR imaging was performed both before and after administration of intravenous contrast.  Contrast: 21mL MULTIHANCE GADOBENATE DIMEGLUMINE 529 MG/ML IV SOLN  Comparison: 12/15/2011  Findings: Low-level edema and sclerosis noted along both sides of the sacroiliac joints in a relatively symmetric fashion.  No findings of avascular necrosis or significant hip effusion.  The small medial femoral neck erosion shown on conventional radiography is observed on the coronal images, with low-level enhancement as shown on image 12 of series 10.  There is also a small fluid collection along the anterior margin of the joint capsule favoring a small amount of iliopsoas bursitis over a labral tear with paralabral cyst.  There is considerable abnormal edema and enhancement in the pubic bodies bilaterally, compatible with osteitis pubis.  The proximal hamstring tendons  appear intact.  No significant enhancement or edema within or along the visualized sciatic nerves; no impingement at the sciatic notch  noted.  IMPRESSION:  1.  Osteitis pubis with bilaterally symmetric sacroiliitis. This constellation of findings can be seen in ankylosing spondylitis and other rheumatologic conditions. 2.  Small erosion of the left medial femoral neck, without joint effusion and without obvious filling defect in the left hip joint to suggest a synovial osteochondromatosis or PVNS.  This could relate to rheumatoid arthropathy. 3.  Small amount of fluid tracking along the iliac this muscle on the left probably is due to iliopsoas bursitis, and is less likely a paralabral cyst associated with otherwise occult labral tear.  Original Report Authenticated By: Carron Curie, M.D.  I, Lynne Leader, personally (independently) visualized and performed the interpretation of the images attached in this note.   Impression and Recommendations:    Assessment and Plan: 39 y.o. female with  Bilateral lateral hip pain.  Due to trochanteric bursitis/hip abductor tendinopathy.  Plan to treat with physical therapy and home exercise program.  Recheck back in 1 month.  Return sooner if needed.  Left anterior hip pain: May be more interarticular.  Patient has a history of abnormal MRI of left hip in 2013.  Her hip x-ray today is largely normal per my interpretation however radiology review is still pending.  If she continues to experience pain in the anterior left hip she may benefit from further evaluation including diagnostic and therapeutic injection or hip MRI arthrogram.  Recheck back in 1 month..   Orders Placed This Encounter  Procedures  . DG Hip Unilat W OR W/O Pelvis 2-3 Views Left    Standing Status:   Future    Number of Occurrences:   1    Standing Expiration Date:   08/10/2020    Order Specific Question:   Reason for Exam (SYMPTOM  OR DIAGNOSIS REQUIRED)    Answer:   eval left hip pain    Order Specific Question:   Is patient pregnant?    Answer:   No    Order Specific Question:   Preferred  imaging location?    Answer:   [facility]    Order Specific Question:   Radiology Contrast Protocol - do NOT remove file path    Answer:   [email or web address]  . Ambulatory referral to Physical Therapy    Referral Priority:   Routine    Referral Type:   Physical Medicine    Referral Reason:   Specialty Services Required    Requested Specialty:   Physical Therapy   No orders of the defined types were placed in this encounter.   Discussed warning signs or symptoms. Please see discharge instructions. Patient expresses understanding.   The above documentation has been reviewed and is accurate and complete Lynne Leader

## 2019-06-15 ENCOUNTER — Other Ambulatory Visit: Payer: Self-pay | Admitting: Internal Medicine

## 2019-06-15 ENCOUNTER — Encounter: Payer: Self-pay | Admitting: Internal Medicine

## 2019-06-27 ENCOUNTER — Encounter: Payer: Self-pay | Admitting: Physical Therapy

## 2019-06-27 ENCOUNTER — Ambulatory Visit: Payer: Commercial Managed Care - PPO | Attending: Family Medicine | Admitting: Physical Therapy

## 2019-06-27 ENCOUNTER — Other Ambulatory Visit: Payer: Self-pay

## 2019-06-27 ENCOUNTER — Ambulatory Visit: Payer: Commercial Managed Care - PPO | Admitting: Physical Therapy

## 2019-06-27 DIAGNOSIS — M25552 Pain in left hip: Secondary | ICD-10-CM

## 2019-06-27 DIAGNOSIS — R262 Difficulty in walking, not elsewhere classified: Secondary | ICD-10-CM | POA: Diagnosis present

## 2019-06-27 DIAGNOSIS — M25551 Pain in right hip: Secondary | ICD-10-CM | POA: Diagnosis present
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
## 2019-07-04 ENCOUNTER — Other Ambulatory Visit: Payer: Self-pay

## 2019-07-04 ENCOUNTER — Other Ambulatory Visit (INDEPENDENT_AMBULATORY_CARE_PROVIDER_SITE_OTHER): Payer: Commercial Managed Care - PPO

## 2019-07-04 DIAGNOSIS — E042 Nontoxic multinodular goiter: Secondary | ICD-10-CM

## 2019-07-04 LAB — T4, FREE: Free T4: 0.8 ng/dL (ref 0.60–1.60)

## 2019-07-04 LAB — TSH: TSH: 0.34 u[IU]/mL — ABNORMAL LOW (ref 0.35–4.50)

## 2019-07-04 LAB — T3, FREE: T3, Free: 3.4 pg/mL (ref 2.3–4.2)

## 2019-07-06 ENCOUNTER — Ambulatory Visit: Payer: Commercial Managed Care - PPO | Admitting: Endocrinology

## 2019-07-06 ENCOUNTER — Other Ambulatory Visit: Payer: Self-pay

## 2019-07-07 ENCOUNTER — Encounter: Payer: Self-pay | Admitting: Physical Therapy

## 2019-07-07 ENCOUNTER — Other Ambulatory Visit: Payer: Self-pay

## 2019-07-07 ENCOUNTER — Ambulatory Visit: Payer: Commercial Managed Care - PPO | Admitting: Physical Therapy

## 2019-07-07 DIAGNOSIS — M25552 Pain in left hip: Secondary | ICD-10-CM

## 2019-07-07 DIAGNOSIS — M25551 Pain in right hip: Secondary | ICD-10-CM

## 2019-07-07 DIAGNOSIS — R262 Difficulty in walking, not elsewhere classified: Secondary | ICD-10-CM
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
## 2019-07-11 ENCOUNTER — Other Ambulatory Visit: Payer: Self-pay

## 2019-07-11 ENCOUNTER — Encounter: Payer: Self-pay | Admitting: Endocrinology

## 2019-07-11 ENCOUNTER — Ambulatory Visit (INDEPENDENT_AMBULATORY_CARE_PROVIDER_SITE_OTHER): Payer: Commercial Managed Care - PPO | Admitting: Endocrinology

## 2019-07-11 VITALS — BP 124/74 | HR 89 | Ht 69.0 in | Wt 196.4 lb

## 2019-07-11 DIAGNOSIS — E042 Nontoxic multinodular goiter: Secondary | ICD-10-CM
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
## 2019-07-12 ENCOUNTER — Encounter: Payer: Self-pay | Admitting: Physical Therapy

## 2019-07-12 ENCOUNTER — Ambulatory Visit: Payer: Commercial Managed Care - PPO | Attending: Family Medicine | Admitting: Physical Therapy

## 2019-07-12 DIAGNOSIS — M25551 Pain in right hip: Secondary | ICD-10-CM | POA: Insufficient documentation

## 2019-07-12 DIAGNOSIS — M25552 Pain in left hip: Secondary | ICD-10-CM

## 2019-07-12 DIAGNOSIS — R262 Difficulty in walking, not elsewhere classified: Secondary | ICD-10-CM | POA: Insufficient documentation
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
## 2019-07-13 ENCOUNTER — Other Ambulatory Visit (HOSPITAL_COMMUNITY)
Admission: RE | Admit: 2019-07-13 | Discharge: 2019-07-13 | Disposition: A | Discharge status: Home / Self Care | Payer: Commercial Managed Care - PPO | Source: Ambulatory Visit | Attending: Endocrinology | Admitting: Endocrinology

## 2019-07-13 ENCOUNTER — Ambulatory Visit
Admission: RE | Admit: 2019-07-13 | Discharge: 2019-07-13 | Disposition: A | Discharge status: Home / Self Care | Payer: Commercial Managed Care - PPO | Source: Ambulatory Visit | Attending: Endocrinology | Admitting: Endocrinology

## 2019-07-13 DIAGNOSIS — E042 Nontoxic multinodular goiter: Secondary | ICD-10-CM

## 2019-07-13 NOTE — Procedures (Signed)
PROCEDURE SUMMARY:  Using direct ultrasound guidance, 5 passes were made into the nodule within the right lobe of the thyroid and  5 passes were made into the nodule within the left lobe of the thyroid using 25 g needles.  Ultrasound was used to confirm needle placements on all occasions.   EBL = trace  Specimens were sent to Pathology for analysis.  See procedure note under Imaging tab in Epic for full procedure details.  Keyshaun Exley S Lucille Crichlow PA-C 07/13/2019 10:47 AM

## 2019-07-14 ENCOUNTER — Encounter: Payer: Self-pay | Admitting: Physical Therapy

## 2019-07-14 ENCOUNTER — Other Ambulatory Visit: Payer: Self-pay

## 2019-07-14 ENCOUNTER — Ambulatory Visit: Payer: Commercial Managed Care - PPO | Admitting: Physical Therapy

## 2019-07-14 DIAGNOSIS — M25552 Pain in left hip: Secondary | ICD-10-CM

## 2019-07-14 DIAGNOSIS — R262 Difficulty in walking, not elsewhere classified: Secondary | ICD-10-CM

## 2019-07-14 DIAGNOSIS — M25551 Pain in right hip: Secondary | ICD-10-CM

## 2019-07-14 LAB — CYTOLOGY - NON PAP
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
## 2019-07-18 ENCOUNTER — Ambulatory Visit: Payer: Commercial Managed Care - PPO | Admitting: Family Medicine

## 2019-07-19 ENCOUNTER — Ambulatory Visit: Payer: Commercial Managed Care - PPO | Admitting: Physical Therapy

## 2019-07-21 ENCOUNTER — Ambulatory Visit: Payer: Commercial Managed Care - PPO | Admitting: Physical Therapy

## 2019-08-09 ENCOUNTER — Encounter: Payer: Self-pay | Admitting: Internal Medicine

## 2019-08-09 ENCOUNTER — Other Ambulatory Visit: Payer: Self-pay

## 2019-08-09 MED ORDER — POTASSIUM CHLORIDE CRYS ER 20 MEQ PO TBCR: 20.0000 meq | EXTENDED_RELEASE_TABLET | Freq: Two times a day (BID) | ORAL | 0 refills | Status: DC

## 2019-08-15 ENCOUNTER — Encounter: Payer: Self-pay | Admitting: Physical Therapy

## 2019-08-15 ENCOUNTER — Ambulatory Visit: Payer: Commercial Managed Care - PPO | Attending: Family Medicine | Admitting: Physical Therapy

## 2019-08-15 ENCOUNTER — Other Ambulatory Visit: Payer: Self-pay

## 2019-08-15 DIAGNOSIS — R262 Difficulty in walking, not elsewhere classified: Secondary | ICD-10-CM | POA: Insufficient documentation

## 2019-08-15 DIAGNOSIS — M25551 Pain in right hip: Secondary | ICD-10-CM | POA: Insufficient documentation

## 2019-08-15 DIAGNOSIS — M25552 Pain in left hip: Secondary | ICD-10-CM | POA: Diagnosis present
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
## 2019-08-17 ENCOUNTER — Ambulatory Visit: Payer: Commercial Managed Care - PPO | Admitting: Physical Therapy

## 2019-08-22 ENCOUNTER — Ambulatory Visit: Payer: Commercial Managed Care - PPO | Admitting: Physical Therapy

## 2019-08-24 ENCOUNTER — Encounter: Payer: Commercial Managed Care - PPO | Admitting: Physical Therapy

## 2019-08-29 ENCOUNTER — Encounter: Payer: Commercial Managed Care - PPO | Admitting: Physical Therapy

## 2019-08-31 ENCOUNTER — Encounter: Payer: Commercial Managed Care - PPO | Admitting: Physical Therapy

## 2019-11-21 DIAGNOSIS — E876 Hypokalemia: Secondary | ICD-10-CM | POA: Insufficient documentation

## 2019-11-21 NOTE — Assessment & Plan Note (Signed)
Secondary to hctz Taking potassium BID

## 2019-11-21 NOTE — Patient Instructions (Signed)
  Blood work was ordered.     Medications reviewed and updated.  Changes include :     Your prescription(s) have been submitted to your pharmacy. Please take as directed and contact our office if you believe you are having problem(s) with the medication(s).  A referral was ordered for        Someone will call you to schedule this.    Please followup in 6 months   
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
## 2019-11-22 ENCOUNTER — Encounter: Payer: Commercial Managed Care - PPO | Admitting: Internal Medicine

## 2019-11-22 ENCOUNTER — Encounter: Payer: Self-pay | Admitting: Internal Medicine

## 2019-11-29 NOTE — Patient Instructions (Addendum)
  Medications reviewed and updated.  Changes include :   none    Please followup in 6 months   
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
## 2019-11-30 ENCOUNTER — Encounter: Payer: Self-pay | Admitting: Internal Medicine

## 2019-11-30 ENCOUNTER — Ambulatory Visit (INDEPENDENT_AMBULATORY_CARE_PROVIDER_SITE_OTHER): Payer: Commercial Managed Care - PPO | Admitting: Internal Medicine

## 2019-11-30 ENCOUNTER — Other Ambulatory Visit: Payer: Self-pay

## 2019-11-30 VITALS — BP 118/70 | HR 77 | Temp 98.1°F | Ht 69.0 in | Wt 201.0 lb

## 2019-11-30 DIAGNOSIS — I10 Essential (primary) hypertension: Secondary | ICD-10-CM

## 2019-11-30 DIAGNOSIS — E876 Hypokalemia: Secondary | ICD-10-CM

## 2019-11-30 MED ORDER — POTASSIUM CHLORIDE CRYS ER 20 MEQ PO TBCR: 20.0000 meq | EXTENDED_RELEASE_TABLET | Freq: Every day | ORAL | 3 refills | Status: DC

## 2019-11-30 NOTE — Assessment & Plan Note (Signed)
Chronic Related to hctz Taking potassium 1 daily Potassium level checked by [facility]- WNL Continue potassium at current dose

## 2019-11-30 NOTE — Assessment & Plan Note (Addendum)
Chronic BP well controlled Current regimen effective and well tolerated Continue current medications at current doses Renal function hecked by [facility] and it was very good

## 2020-02-05 ENCOUNTER — Other Ambulatory Visit: Payer: Self-pay | Admitting: Internal Medicine

## 2020-03-12 ENCOUNTER — Encounter: Payer: Self-pay | Admitting: Internal Medicine

## 2020-03-12 ENCOUNTER — Other Ambulatory Visit: Payer: Self-pay

## 2020-03-12 ENCOUNTER — Ambulatory Visit (INDEPENDENT_AMBULATORY_CARE_PROVIDER_SITE_OTHER): Payer: Commercial Managed Care - PPO | Admitting: Nurse Practitioner

## 2020-03-12 ENCOUNTER — Encounter: Payer: Self-pay | Admitting: Nurse Practitioner

## 2020-03-12 VITALS — BP 138/84 | HR 79 | Temp 97.7°F | Ht 69.0 in | Wt 204.0 lb

## 2020-03-12 DIAGNOSIS — L03012 Cellulitis of left finger: Secondary | ICD-10-CM

## 2020-03-12 MED ORDER — AMOXICILLIN-POT CLAVULANATE 875-125 MG PO TABS: 1.0000 | ORAL_TABLET | Freq: Two times a day (BID) | ORAL | 0 refills | Status: DC

## 2020-03-12 MED ORDER — IBUPROFEN 600 MG PO TABS: 600.0000 mg | ORAL_TABLET | Freq: Three times a day (TID) | ORAL | 0 refills | Status: DC | PRN

## 2020-03-12 NOTE — Progress Notes (Signed)
Subjective:  Patient ID: Sula Soda, female    DOB: Jul 05, 1980  Age: 39 y.o. MRN: [id number]  CC: Acute Visit (Pt c/o hang nail on left index finger that is swollen and painful 2-3 days. )  Hand Pain  The incident occurred 5 to 7 days ago. The incident occurred at home. There was no injury mechanism. Pain location: left index finger. The quality of the pain is described as aching. The pain does not radiate. Pertinent negatives include no numbness or tingling. The symptoms are aggravated by palpation. She has tried nothing for the symptoms.  has dry cuticles and occasionally bites her nails  Reviewed past Medical, Social and Family history today.  Outpatient Medications Prior to Visit  Medication Sig Dispense Refill  . amLODipine (NORVASC) 10 MG tablet Take 1 tablet (10 mg total) by mouth daily. 90 tablet 3  . hydrochlorothiazide (HYDRODIURIL) 25 MG tablet Take 1 tablet (25 mg total) by mouth daily. 90 tablet 3  . KLOR-CON M20 20 MEQ tablet TAKE 1 TABLET BY MOUTH TWICE A DAY 180 tablet 0  . Multiple Vitamins-Minerals (WOMENS MULTI PO) Take by mouth.    . tinidazole (TINDAMAX) 500 MG tablet Take 2,000 mg by mouth once. (Patient not taking: Reported on 03/12/2020)     No facility-administered medications prior to visit.    ROS See HPI  Objective:  BP 138/84 (BP Location: Left Arm, Patient Position: Sitting, Cuff Size: Normal)   Pulse 79   Temp 97.7 F (36.5 C) (Temporal)   Ht 5\' 9"  (1.753 m)   Wt 204 lb (92.5 kg)   SpO2 99%   BMI 30.13 kg/m   Physical Exam Musculoskeletal:        General: Swelling and tenderness present.     Left hand: Tenderness present. Normal range of motion. Normal capillary refill. Normal pulse.       Hands:  Skin:    Findings: Erythema present.  Neurological:     Mental Status: She is alert and oriented to person, place, and time.    Procedure note: Incision and Drainage of an Abscess  Indication : a localized collection of pus that is tender  and not spontaneously resolving. On left index finger  Risks including unsuccessful procedure , possible need for a repeat procedure due to pus accumulation, scar formation, and others as well as benefits were explained to the patient in detail. Verbal consent was obtained.  The patient was placed in a sitting position with left hand on exam table. The area of an abscess on left index finger was prepped with povidone-iodine. Blister incised, scab removed. purulent material was expressed.The cavity was cleaned.  The wound was dressed with antibiotic ointment and a lrge bandaid.  Tolerated well. Complications: None.  Wound instructions provided.   Please contact us if you notice a recollection of pus in the abscess fever and chills increased pain redness red streaks near the abscess increased swelling in the area.  Assessment & Plan:  This visit occurred during the SARS-CoV-2 public health emergency.  Safety protocols were in place, including screening questions prior to the visit, additional usage of staff PPE, and extensive cleaning of exam room while observing appropriate contact time as indicated for disinfecting solutions.   Jacquelynn was seen today for acute visit.  Diagnoses and all orders for this visit:  Paronychia of finger of left hand -     amoxicillin-clavulanate (AUGMENTIN) 875-125 MG tablet; Take 1 tablet by mouth 2 (two) times daily. With food -  ibuprofen (ADVIL) 600 MG tablet; Take 1 tablet (600 mg total) by mouth every 8 (eight) hours as needed (with food).  Soak the affected area in warm water and salt, if told to do so by your health care provider. You may be told to do this for 20 minutes, 2-3 times a day. Keep the area dry when you are not soaking it. Start oral abx due to exposure to oral flora Change dressing daily, apply triple abx ointment with each dressing change x 3days, then just dry dressing.  Problem List Items Addressed This Visit    None    Visit Diagnoses     Paronychia of finger of left hand    -  Primary   Relevant Medications   amoxicillin-clavulanate (AUGMENTIN) 875-125 MG tablet   ibuprofen (ADVIL) 600 MG tablet      Follow-up: No follow-ups on file.  Wilfred Lacy, NP

## 2020-03-12 NOTE — Patient Instructions (Signed)
Keep the affected area clean. Soak the affected area in warm water and salt, if told to do so by your health care provider. You may be told to do this for 20 minutes, 2-3 times a day. Keep the area dry when you are not soaking it.  Start oral abx due to exposure to oral flora Change dressing daily, apply triple abc ointment with each dressing change x 3days, then just dry dressing.  Paronychia Paronychia is an infection of the skin that surrounds a nail. It usually affects the skin around a fingernail, but it may also occur near a toenail. It often causes pain and swelling around the nail. In some cases, a collection of pus (abscess) can form near or under the nail.  This condition may develop suddenly, or it may develop gradually over a longer period. In most cases, paronychia is not serious, and it will clear up with treatment. What are the causes? This condition may be caused by bacteria or a fungus. These germs can enter the body through an opening in the skin, such as a cut or a hangnail. What increases the risk? This condition is more likely to develop in people who:  Get their hands wet often, such as those who work as Designer, industrial/product, bartenders, or nurses.  Bite their fingernails or suck their thumbs.  Trim their nails very short.  Have hangnails or injured fingertips.  Get manicures.  Have diabetes. What are the signs or symptoms? Symptoms of this condition include:  Redness and swelling of the skin near the nail.  Tenderness around the nail when you touch the area.  Pus-filled bumps under the skin at the base and sides of the nail (cuticle).  Fluid or pus under the nail.  Throbbing pain in the area. How is this diagnosed? This condition is diagnosed with a physical exam. In some cases, a sample of pus may be tested to determine what type of bacteria or fungus is causing the condition. How is this treated? Treatment depends on the cause and severity of your condition.  If your condition is mild, it may clear up on its own in a few days or after soaking in warm water. If needed, treatment may include:  Antibiotic medicine, if your infection is caused by bacteria.  Antifungal medicine, if your infection is caused by a fungus.  A procedure to drain pus from an abscess.  Anti-inflammatory medicine (corticosteroids). Follow these instructions at home: Wound care  Keep the affected area clean.  Soak the affected area in warm water, if told to do so by your health care provider. You may be told to do this for 20 minutes, 2-3 times a day.  Keep the area dry when you are not soaking it.  Do not try to drain an abscess yourself.  Follow instructions from your health care provider about how to take care of the affected area. Make sure you: ? Wash your hands with soap and water before you change your bandage (dressing). If soap and water are not available, use hand sanitizer. ? Change your dressing as told by your health care provider.  If you had an abscess drained, check the area every day for signs of infection. Check for: ? Redness, swelling, or pain. ? Fluid or blood. ? Warmth. ? Pus or a bad smell. Medicines   Take over-the-counter and prescription medicines only as told by your health care provider.  If you were prescribed an antibiotic medicine, take it as told by your  health care provider. Do not stop taking the antibiotic even if you start to feel better. General instructions  Avoid contact with harsh chemicals.  Do not pick at the affected area. Prevention  To prevent this condition from happening again: ? Wear rubber gloves when washing dishes or doing other tasks that require your hands to get wet. ? Wear gloves if your hands might come in contact with cleaners or other chemicals. ? Avoid injuring your nails or fingertips. ? Do not bite your nails or tear hangnails. ? Do not cut your nails very short. ? Do not cut your  cuticles. ? Use clean nail clippers or scissors when trimming nails. Contact a health care provider if:  Your symptoms get worse or do not improve with treatment.  You have continued or increased fluid, blood, or pus coming from the affected area.  Your finger or knuckle becomes swollen or difficult to move. Get help right away if you have:  A fever or chills.  Redness spreading away from the affected area.  Joint or muscle pain. Summary  Paronychia is an infection of the skin that surrounds a nail. It often causes pain and swelling around the nail. In some cases, a collection of pus (abscess) can form near or under the nail.  This condition may be caused by bacteria or a fungus. These germs can enter the body through an opening in the skin, such as a cut or a hangnail.  If your condition is mild, it may clear up on its own in a few days. If needed, treatment may include medicine or a procedure to drain pus from an abscess.  To prevent this condition from happening again, wear gloves if doing tasks that require your hands to get wet or to come in contact with chemicals. Also avoid injuring your nails or fingertips. This information is not intended to replace advice given to you by your health care provider. Make sure you discuss any questions you have with your health care provider. Document Revised: 06/11/2017 Document Reviewed: 06/07/2017 Elsevier Patient Education  2020 [employer].

## 2020-03-22 ENCOUNTER — Telehealth: Payer: Self-pay | Admitting: Internal Medicine

## 2020-03-22 NOTE — Telephone Encounter (Signed)
[facility] Report/Call : ---caller may be having a reaction to a new cleansing soap/ she's itching on her face and neck where the product was applied and her face is starting swell. Tried the product last night and began to itch more this morning.  Advised HOME CARE given.
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
## 2020-04-02 ENCOUNTER — Other Ambulatory Visit: Payer: Self-pay

## 2020-04-02 ENCOUNTER — Encounter: Payer: Self-pay | Admitting: Internal Medicine

## 2020-04-02 ENCOUNTER — Ambulatory Visit (INDEPENDENT_AMBULATORY_CARE_PROVIDER_SITE_OTHER): Payer: Commercial Managed Care - PPO | Admitting: Internal Medicine

## 2020-04-02 DIAGNOSIS — M25512 Pain in left shoulder: Secondary | ICD-10-CM | POA: Diagnosis not present

## 2020-04-02 NOTE — Patient Instructions (Signed)
A referral was ordered for [facility].  They will call you to schedule an appointment.

## 2020-04-02 NOTE — Assessment & Plan Note (Signed)
Acute Related to flu injection on 9/14 With pain, dec ROM - symptoms worsening Will refer to ortho Continue ibuprofen prn, can ice

## 2020-04-09 ENCOUNTER — Ambulatory Visit (INDEPENDENT_AMBULATORY_CARE_PROVIDER_SITE_OTHER): Payer: Commercial Managed Care - PPO | Admitting: Orthopaedic Surgery

## 2020-04-09 ENCOUNTER — Encounter: Payer: Self-pay | Admitting: Orthopaedic Surgery

## 2020-04-09 ENCOUNTER — Ambulatory Visit: Payer: Self-pay

## 2020-04-09 DIAGNOSIS — M25512 Pain in left shoulder: Secondary | ICD-10-CM

## 2020-04-09 MED ORDER — METHYLPREDNISOLONE 4 MG PO TABS: ORAL_TABLET | ORAL | 0 refills | Status: DC

## 2020-04-09 MED ORDER — METHOCARBAMOL 500 MG PO TABS: 500.0000 mg | ORAL_TABLET | Freq: Four times a day (QID) | ORAL | 1 refills | Status: DC | PRN
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
## 2020-04-23 ENCOUNTER — Ambulatory Visit (INDEPENDENT_AMBULATORY_CARE_PROVIDER_SITE_OTHER): Payer: Commercial Managed Care - PPO | Admitting: Orthopaedic Surgery

## 2020-04-23 ENCOUNTER — Encounter: Payer: Self-pay | Admitting: Orthopaedic Surgery

## 2020-04-23 DIAGNOSIS — M25512 Pain in left shoulder: Secondary | ICD-10-CM | POA: Diagnosis not present

## 2020-04-23 MED ORDER — METHYLPREDNISOLONE ACETATE 40 MG/ML IJ SUSP
40.0000 mg | INTRAMUSCULAR | Status: AC | PRN
  Administered 2020-04-23: 40 mg via INTRA_ARTICULAR

## 2020-04-23 MED ORDER — LIDOCAINE HCL 1 % IJ SOLN
3.0000 mL | INTRAMUSCULAR | Status: AC | PRN
  Administered 2020-04-23: 3 mL
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
## 2020-04-26 ENCOUNTER — Ambulatory Visit: Payer: Commercial Managed Care - PPO | Attending: Internal Medicine

## 2020-04-26 DIAGNOSIS — Z23 Encounter for immunization: Secondary | ICD-10-CM

## 2020-04-26 NOTE — Progress Notes (Signed)
° °  Cov[facility] Vaccination [facility]  Name:  Chloe Elliott    MRN: [id number] DOB: 18-Jul-1980  04/26/2020  [name] was observed post Covid-19 immunization for 15 minutes without incident. She was provided with Vaccine Information Sheet and instruction to access the V-Safe system.   [name] was instructed to call 911 with any severe reactions post vaccine:  Difficulty breathing   Swelling of face and throat   A fast heartbeat   A bad rash all over body   Dizziness and weakness   Immunizations Administered    No immunizations on file.

## 2020-05-07 ENCOUNTER — Ambulatory Visit (INDEPENDENT_AMBULATORY_CARE_PROVIDER_SITE_OTHER): Payer: Commercial Managed Care - PPO | Admitting: Orthopaedic Surgery

## 2020-05-07 DIAGNOSIS — M25512 Pain in left shoulder: Secondary | ICD-10-CM | POA: Diagnosis not present
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
## 2020-05-18 ENCOUNTER — Other Ambulatory Visit: Payer: Self-pay | Admitting: Internal Medicine

## 2020-05-23 NOTE — Patient Instructions (Addendum)
Blood work was ordered.     No immunization administered today.   Medications changes include :   none  Your prescription(s) have been submitted to your pharmacy.    Please followup in 6 months   Health Maintenance, Female Adopting a healthy lifestyle and getting preventive care are important in promoting health and wellness. Ask your health care provider about:  The right schedule for you to have regular tests and exams.  Things you can do on your own to prevent diseases and keep yourself healthy. What should I know about diet, weight, and exercise? Eat a healthy diet   Eat a diet that includes plenty of vegetables, fruits, low-fat dairy products, and lean protein.  Do not eat a lot of foods that are high in solid fats, added sugars, or sodium. Maintain a healthy weight Body mass index (BMI) is used to identify weight problems. It estimates body fat based on height and weight. Your health care provider can help determine your BMI and help you achieve or maintain a healthy weight. Get regular exercise Get regular exercise. This is one of the most important things you can do for your health. Most adults should:  Exercise for at least 150 minutes each week. The exercise should increase your heart rate and make you sweat (moderate-intensity exercise).  Do strengthening exercises at least twice a week. This is in addition to the moderate-intensity exercise.  Spend less time sitting. Even light physical activity can be beneficial. Watch cholesterol and blood lipids Have your blood tested for lipids and cholesterol at 41 years of age, then have this test every 5 years. Have your cholesterol levels checked more often if:  Your lipid or cholesterol levels are high.  You are older than 41 years of age.  You are at high risk for heart disease. What should I know about cancer screening? Depending on your health history and family history, you may need to have cancer screening at  various ages. This may include screening for:  Breast cancer.  Cervical cancer.  Colorectal cancer.  Skin cancer.  Lung cancer. What should I know about heart disease, diabetes, and high blood pressure? Blood pressure and heart disease  High blood pressure causes heart disease and increases the risk of stroke. This is more likely to develop in people who have high blood pressure readings, are of African descent, or are overweight.  Have your blood pressure checked: ? Every 3-5 years if you are 54-68 years of age. ? Every year if you are 60 years old or older. Diabetes Have regular diabetes screenings. This checks your fasting blood sugar level. Have the screening done:  Once every three years after age 60 if you are at a normal weight and have a low risk for diabetes.  More often and at a younger age if you are overweight or have a high risk for diabetes. What should I know about preventing infection? Hepatitis B If you have a higher risk for hepatitis B, you should be screened for this virus. Talk with your health care provider to find out if you are at risk for hepatitis B infection. Hepatitis C Testing is recommended for:  Everyone born from [birth year] through 1967.  Anyone with known risk factors for hepatitis C. Sexually transmitted infections (STIs)  Get screened for STIs, including gonorrhea and chlamydia, if: ? You are sexually active and are younger than 41 years of age. ? You are older than 41 years of age and your health care  provider tells you that you are at risk for this type of infection. ? Your sexual activity has changed since you were last screened, and you are at increased risk for chlamydia or gonorrhea. Ask your health care provider if you are at risk.  Ask your health care provider about whether you are at high risk for HIV. Your health care provider may recommend a prescription medicine to help prevent HIV infection. If you choose to take medicine to prevent  HIV, you should first get tested for HIV. You should then be tested every 3 months for as long as you are taking the medicine. Pregnancy  If you are about to stop having your period (premenopausal) and you may become pregnant, seek counseling before you get pregnant.  Take 400 to 800 micrograms (mcg) of folic acid every day if you become pregnant.  Ask for birth control (contraception) if you want to prevent pregnancy. Osteoporosis and menopause Osteoporosis is a disease in which the bones lose minerals and strength with aging. This can result in bone fractures. If you are 27 years old or older, or if you are at risk for osteoporosis and fractures, ask your health care provider if you should:  Be screened for bone loss.  Take a calcium or vitamin D supplement to lower your risk of fractures.  Be given hormone replacement therapy (HRT) to treat symptoms of menopause. Follow these instructions at home: Lifestyle  Do not use any products that contain nicotine or tobacco, such as cigarettes, e-cigarettes, and chewing tobacco. If you need help quitting, ask your health care provider.  Do not use street drugs.  Do not share needles.  Ask your health care provider for help if you need support or information about quitting drugs. Alcohol use  Do not drink alcohol if: ? Your health care provider tells you not to drink. ? You are pregnant, may be pregnant, or are planning to become pregnant.  If you drink alcohol: ? Limit how much you use to 0-1 drink a day. ? Limit intake if you are breastfeeding.  Be aware of how much alcohol is in your drink. In the [place] one drink equals one 12 oz bottle of beer (355 mL), one 5 oz glass of wine (148 mL), or one 1 oz glass of hard liquor (44 mL). General instructions  Schedule regular health, dental, and eye exams.  Stay current with your vaccines.  Tell your health care provider if: ? You often feel depressed. ? You have ever been abused or do  not feel safe at home. Summary  Adopting a healthy lifestyle and getting preventive care are important in promoting health and wellness.  Follow your health care provider's instructions about healthy diet, exercising, and getting tested or screened for diseases.  Follow your health care provider's instructions on monitoring your cholesterol and blood pressure. This information is not intended to replace advice given to you by your health care provider. Make sure you discuss any questions you have with your health care provider. Document Revised: 05/18/2018 Document Reviewed: 05/18/2018 Elsevier Patient Education  2020 [employer].
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
## 2020-05-24 ENCOUNTER — Encounter: Payer: Self-pay | Admitting: Internal Medicine

## 2020-05-24 ENCOUNTER — Ambulatory Visit (INDEPENDENT_AMBULATORY_CARE_PROVIDER_SITE_OTHER): Payer: Commercial Managed Care - PPO | Admitting: Internal Medicine

## 2020-05-24 ENCOUNTER — Other Ambulatory Visit: Payer: Self-pay

## 2020-05-24 VITALS — BP 128/80 | HR 73 | Temp 98.6°F | Ht 69.0 in | Wt 206.0 lb

## 2020-05-24 DIAGNOSIS — Z833 Family history of diabetes mellitus: Secondary | ICD-10-CM

## 2020-05-24 DIAGNOSIS — I10 Essential (primary) hypertension: Secondary | ICD-10-CM

## 2020-05-24 DIAGNOSIS — Z0000 Encounter for general adult medical examination without abnormal findings: Secondary | ICD-10-CM | POA: Diagnosis not present

## 2020-05-24 DIAGNOSIS — E010 Iodine-deficiency related diffuse (endemic) goiter: Secondary | ICD-10-CM

## 2020-05-24 DIAGNOSIS — E876 Hypokalemia: Secondary | ICD-10-CM

## 2020-05-24 DIAGNOSIS — Z1159 Encounter for screening for other viral diseases: Secondary | ICD-10-CM

## 2020-05-24 LAB — COMPREHENSIVE METABOLIC PANEL
ALT: 14 U/L (ref 0–35)
AST: 17 U/L (ref 0–37)
Albumin: 4.2 g/dL (ref 3.5–5.2)
Alkaline Phosphatase: 56 U/L (ref 39–117)
BUN: 15 mg/dL (ref 6–23)
CO2: 29 mEq/L (ref 19–32)
Calcium: 9.6 mg/dL (ref 8.4–10.5)
Chloride: 102 mEq/L (ref 96–112)
Creatinine, Ser: 0.85 mg/dL (ref 0.40–1.20)
GFR: 84.94 mL/min (ref >60.00)
Glucose, Bld: 80 mg/dL (ref 70–99)
Potassium: 3.7 mEq/L (ref 3.5–5.1)
Sodium: 137 mEq/L (ref 135–145)
Total Bilirubin: 0.6 mg/dL (ref 0.2–1.2)
Total Protein: 8 g/dL (ref 6.0–8.3)

## 2020-05-24 LAB — CBC WITH DIFFERENTIAL/PLATELET
Basophils Absolute: 0 10*3/uL (ref 0.0–0.1)
Basophils Relative: 1.2 % (ref 0.0–3.0)
Eosinophils Absolute: 0.1 10*3/uL (ref 0.0–0.7)
Eosinophils Relative: 2.2 % (ref 0.0–5.0)
HCT: 39.6 % (ref 36.0–46.0)
Hemoglobin: 13.8 g/dL (ref 12.0–15.0)
Lymphocytes Relative: 46.1 % — ABNORMAL HIGH (ref 12.0–46.0)
Lymphs Abs: 1.8 10*3/uL (ref 0.7–4.0)
MCHC: 34.8 g/dL (ref 30.0–36.0)
MCV: 91.2 fl (ref 78.0–100.0)
Monocytes Absolute: 0.5 10*3/uL (ref 0.1–1.0)
Monocytes Relative: 13.1 % — ABNORMAL HIGH (ref 3.0–12.0)
Neutro Abs: 1.5 10*3/uL (ref 1.4–7.7)
Neutrophils Relative %: 37.4 % — ABNORMAL LOW (ref 43.0–77.0)
Platelets: 274 10*3/uL (ref 150.0–400.0)
RBC: 4.34 Mil/uL (ref 3.87–5.11)
RDW: 12.3 % (ref 11.5–15.5)
WBC: 4 10*3/uL (ref 4.0–10.5)

## 2020-05-24 LAB — LIPID PANEL
Cholesterol: 152 mg/dL (ref 0–200)
HDL: 68.4 mg/dL (ref >39.00)
LDL Cholesterol: 76 mg/dL (ref 0–99)
NonHDL: 84.03
Total CHOL/HDL Ratio: 2
Triglycerides: 39 mg/dL (ref 0.0–149.0)
VLDL: 7.8 mg/dL (ref 0.0–40.0)

## 2020-05-24 LAB — HEMOGLOBIN A1C: Hgb A1c MFr Bld: 5.3 % (ref 4.6–6.5)

## 2020-05-24 MED ORDER — POTASSIUM CHLORIDE CRYS ER 20 MEQ PO TBCR: 20.0000 meq | EXTENDED_RELEASE_TABLET | Freq: Every day | ORAL | 3 refills | Status: DC

## 2020-05-24 NOTE — Assessment & Plan Note (Signed)
Chronic °BP well controlled °Continue amlodipine 10 mg daily, hctz 25 mg daily °cmp ° °

## 2020-05-24 NOTE — Assessment & Plan Note (Signed)
Chronic a1c 

## 2020-05-24 NOTE — Assessment & Plan Note (Signed)
Chronic cmp Continue klor -con 20 mEq daily - will adjust if needed

## 2020-05-27 LAB — HEPATITIS C ANTIBODY
Hepatitis C Ab: NONREACTIVE
SIGNAL TO CUT-OFF: 0.01 (ref <1.00)

## 2020-06-12 ENCOUNTER — Encounter: Payer: Self-pay | Admitting: Internal Medicine

## 2020-06-13 ENCOUNTER — Ambulatory Visit (INDEPENDENT_AMBULATORY_CARE_PROVIDER_SITE_OTHER): Payer: Commercial Managed Care - PPO | Admitting: Internal Medicine

## 2020-06-13 ENCOUNTER — Encounter: Payer: Self-pay | Admitting: Internal Medicine

## 2020-06-13 ENCOUNTER — Other Ambulatory Visit: Payer: Self-pay

## 2020-06-13 VITALS — BP 116/76 | HR 108 | Temp 98.6°F | Ht 69.0 in | Wt 208.0 lb

## 2020-06-13 DIAGNOSIS — R82998 Other abnormal findings in urine: Secondary | ICD-10-CM | POA: Insufficient documentation

## 2020-06-13 DIAGNOSIS — R300 Dysuria: Secondary | ICD-10-CM | POA: Diagnosis not present

## 2020-06-13 DIAGNOSIS — M549 Dorsalgia, unspecified: Secondary | ICD-10-CM | POA: Diagnosis not present

## 2020-06-13 LAB — POCT URINALYSIS DIPSTICK
Bilirubin, UA: NEGATIVE
Blood, UA: NEGATIVE
Glucose, UA: NEGATIVE
Ketones, UA: NEGATIVE
Leukocytes, UA: NEGATIVE
Nitrite, UA: NEGATIVE
Protein, UA: NEGATIVE
Spec Grav, UA: 1.015 (ref 1.010–1.025)
Urobilinogen, UA: 0.2 E.U./dL
pH, UA: 7.5 (ref 5.0–8.0)

## 2020-06-13 MED ORDER — METHOCARBAMOL 500 MG PO TABS: 500.0000 mg | ORAL_TABLET | Freq: Four times a day (QID) | ORAL | 0 refills | Status: DC | PRN

## 2020-06-13 NOTE — Patient Instructions (Signed)
Your urine shows no infection.  Your pain is likely muscular in nature.  Try the muscle relaxer and take ibuprofen.  Use heat.     Please call if there is no improvement in your symptoms.
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
## 2020-06-13 NOTE — Assessment & Plan Note (Signed)
Acute Likely msk in nature Advised heat, stretching Start robaxin 500 mg Q 6 hr prn Call if no improvement

## 2020-06-13 NOTE — Assessment & Plan Note (Signed)
Acute Urine dip is negative for infection and symptoms not consistent with infection Dark urine likely related to dehydration Increase fluids No abx or culture needed

## 2020-07-09 ENCOUNTER — Other Ambulatory Visit: Payer: Commercial Managed Care - PPO

## 2020-07-11 ENCOUNTER — Ambulatory Visit: Payer: Commercial Managed Care - PPO | Admitting: Endocrinology

## 2020-07-31 ENCOUNTER — Other Ambulatory Visit: Payer: Self-pay | Admitting: Endocrinology

## 2020-07-31 DIAGNOSIS — E042 Nontoxic multinodular goiter: Secondary | ICD-10-CM

## 2020-08-02 ENCOUNTER — Other Ambulatory Visit: Payer: Self-pay

## 2020-08-02 ENCOUNTER — Other Ambulatory Visit (INDEPENDENT_AMBULATORY_CARE_PROVIDER_SITE_OTHER): Payer: Commercial Managed Care - PPO

## 2020-08-02 DIAGNOSIS — E042 Nontoxic multinodular goiter: Secondary | ICD-10-CM | POA: Diagnosis not present

## 2020-08-02 LAB — T3, FREE: T3, Free: 3.4 pg/mL (ref 2.3–4.2)

## 2020-08-02 LAB — TSH: TSH: 0.31 u[IU]/mL — ABNORMAL LOW (ref 0.35–4.50)

## 2020-08-02 LAB — T4, FREE: Free T4: 0.86 ng/dL (ref 0.60–1.60)
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
## 2020-08-06 ENCOUNTER — Ambulatory Visit (INDEPENDENT_AMBULATORY_CARE_PROVIDER_SITE_OTHER): Payer: Commercial Managed Care - PPO | Admitting: Endocrinology

## 2020-08-06 ENCOUNTER — Encounter: Payer: Self-pay | Admitting: Endocrinology

## 2020-08-06 ENCOUNTER — Other Ambulatory Visit: Payer: Self-pay

## 2020-08-06 VITALS — BP 126/82 | HR 86 | Ht 69.0 in | Wt 202.0 lb

## 2020-08-06 DIAGNOSIS — E042 Nontoxic multinodular goiter: Secondary | ICD-10-CM

## 2020-10-04 LAB — HM MAMMOGRAPHY

## 2020-10-11 ENCOUNTER — Encounter: Payer: Self-pay | Admitting: Internal Medicine

## 2020-10-15 LAB — HM MAMMOGRAPHY

## 2020-11-22 ENCOUNTER — Ambulatory Visit: Payer: Commercial Managed Care - PPO | Admitting: Internal Medicine

## 2020-11-22 ENCOUNTER — Telehealth: Payer: Commercial Managed Care - PPO | Admitting: Internal Medicine

## 2020-11-28 NOTE — Patient Instructions (Addendum)
  Blood work was ordered.     Medications changes include :   phentermine 37.5 mg   Your prescription(s) have been submitted to your pharmacy. Please take as directed and contact our office if you believe you are having problem(s) with the medication(s).    Please followup in 6 months
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
## 2020-11-29 ENCOUNTER — Other Ambulatory Visit: Payer: Self-pay

## 2020-11-29 ENCOUNTER — Encounter: Payer: Self-pay | Admitting: Internal Medicine

## 2020-11-29 ENCOUNTER — Ambulatory Visit (INDEPENDENT_AMBULATORY_CARE_PROVIDER_SITE_OTHER): Payer: Commercial Managed Care - PPO | Admitting: Internal Medicine

## 2020-11-29 VITALS — BP 122/82 | HR 78 | Temp 98.2°F | Ht 69.0 in | Wt 217.0 lb

## 2020-11-29 DIAGNOSIS — E876 Hypokalemia: Secondary | ICD-10-CM | POA: Diagnosis not present

## 2020-11-29 DIAGNOSIS — I10 Essential (primary) hypertension: Secondary | ICD-10-CM | POA: Diagnosis not present

## 2020-11-29 DIAGNOSIS — E669 Obesity, unspecified: Secondary | ICD-10-CM | POA: Diagnosis not present

## 2020-11-29 LAB — BASIC METABOLIC PANEL
BUN: 13 mg/dL (ref 6–23)
CO2: 32 mEq/L (ref 19–32)
Calcium: 9.2 mg/dL (ref 8.4–10.5)
Chloride: 101 mEq/L (ref 96–112)
Creatinine, Ser: 0.72 mg/dL (ref 0.40–1.20)
GFR: 103.28 mL/min (ref >60.00)
Glucose, Bld: 89 mg/dL (ref 70–99)
Potassium: 3.4 mEq/L — ABNORMAL LOW (ref 3.5–5.1)
Sodium: 138 mEq/L (ref 135–145)

## 2020-11-29 MED ORDER — PHENTERMINE HCL 37.5 MG PO CAPS: 37.5000 mg | ORAL_CAPSULE | ORAL | 2 refills | Status: DC

## 2020-11-29 MED ORDER — POTASSIUM CHLORIDE CRYS ER 20 MEQ PO TBCR: 20.0000 meq | EXTENDED_RELEASE_TABLET | Freq: Every day | ORAL | 3 refills | Status: DC

## 2020-11-29 NOTE — Addendum Note (Signed)
Addended by: Boris Lown B on: 11/29/2020 09:21 AM   Modules accepted: Orders

## 2020-11-29 NOTE — Assessment & Plan Note (Signed)
Chronic Has increased stress, increased eating  Increase exercise Will try phentermine 37.5 mg daily Discussed decreased calorie intake

## 2020-11-29 NOTE — Assessment & Plan Note (Signed)
Chronic BP well controlled Continue amlodipine 10 mg qd, hctz 25 mg qd bmp

## 2020-11-29 NOTE — Assessment & Plan Note (Signed)
Chronic Related to hctz Continue Klor con 20 meq daily bmp

## 2021-05-26 ENCOUNTER — Encounter: Payer: Self-pay | Admitting: Internal Medicine
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
## 2021-05-26 NOTE — Patient Instructions (Addendum)
° °  Tetanus vaccine given today   Blood work was ordered.     Medications changes include :  none   Your prescription(s) have been submitted to your pharmacy. Please take as directed and contact our office if you believe you are having problem(s) with the medication(s).     Please followup in 6 months

## 2021-05-27 ENCOUNTER — Other Ambulatory Visit: Payer: Self-pay

## 2021-05-27 ENCOUNTER — Ambulatory Visit (INDEPENDENT_AMBULATORY_CARE_PROVIDER_SITE_OTHER): Payer: Commercial Managed Care - PPO | Admitting: Internal Medicine

## 2021-05-27 VITALS — BP 112/78 | HR 84 | Temp 98.7°F | Ht 69.0 in | Wt 214.0 lb

## 2021-05-27 DIAGNOSIS — I10 Essential (primary) hypertension: Secondary | ICD-10-CM | POA: Diagnosis not present

## 2021-05-27 DIAGNOSIS — E010 Iodine-deficiency related diffuse (endemic) goiter: Secondary | ICD-10-CM

## 2021-05-27 DIAGNOSIS — Z0000 Encounter for general adult medical examination without abnormal findings: Secondary | ICD-10-CM

## 2021-05-27 DIAGNOSIS — E876 Hypokalemia: Secondary | ICD-10-CM

## 2021-05-27 DIAGNOSIS — Z23 Encounter for immunization: Secondary | ICD-10-CM

## 2021-05-27 DIAGNOSIS — E669 Obesity, unspecified: Secondary | ICD-10-CM | POA: Diagnosis not present

## 2021-05-27 LAB — COMPREHENSIVE METABOLIC PANEL
ALT: 16 U/L (ref 0–35)
AST: 20 U/L (ref 0–37)
Albumin: 4.1 g/dL (ref 3.5–5.2)
Alkaline Phosphatase: 60 U/L (ref 39–117)
BUN: 13 mg/dL (ref 6–23)
CO2: 30 mEq/L (ref 19–32)
Calcium: 9.6 mg/dL (ref 8.4–10.5)
Chloride: 101 mEq/L (ref 96–112)
Creatinine, Ser: 0.84 mg/dL (ref 0.40–1.20)
GFR: 85.54 mL/min (ref >60.00)
Glucose, Bld: 85 mg/dL (ref 70–99)
Potassium: 3.4 mEq/L — ABNORMAL LOW (ref 3.5–5.1)
Sodium: 138 mEq/L (ref 135–145)
Total Bilirubin: 0.8 mg/dL (ref 0.2–1.2)
Total Protein: 7.8 g/dL (ref 6.0–8.3)

## 2021-05-27 LAB — CBC WITH DIFFERENTIAL/PLATELET
Basophils Absolute: 0 10*3/uL (ref 0.0–0.1)
Basophils Relative: 0.5 % (ref 0.0–3.0)
Eosinophils Absolute: 0.2 10*3/uL (ref 0.0–0.7)
Eosinophils Relative: 6.2 % — ABNORMAL HIGH (ref 0.0–5.0)
HCT: 38.7 % (ref 36.0–46.0)
Hemoglobin: 13.2 g/dL (ref 12.0–15.0)
Lymphocytes Relative: 44.2 % (ref 12.0–46.0)
Lymphs Abs: 1.7 10*3/uL (ref 0.7–4.0)
MCHC: 34.1 g/dL (ref 30.0–36.0)
MCV: 91.3 fl (ref 78.0–100.0)
Monocytes Absolute: 0.4 10*3/uL (ref 0.1–1.0)
Monocytes Relative: 11.4 % (ref 3.0–12.0)
Neutro Abs: 1.5 10*3/uL (ref 1.4–7.7)
Neutrophils Relative %: 37.7 % — ABNORMAL LOW (ref 43.0–77.0)
Platelets: 289 10*3/uL (ref 150.0–400.0)
RBC: 4.24 Mil/uL (ref 3.87–5.11)
RDW: 12.4 % (ref 11.5–15.5)
WBC: 3.9 10*3/uL — ABNORMAL LOW (ref 4.0–10.5)

## 2021-05-27 LAB — LIPID PANEL
Cholesterol: 134 mg/dL (ref 0–200)
HDL: 59.1 mg/dL (ref >39.00)
LDL Cholesterol: 66 mg/dL (ref 0–99)
NonHDL: 74.62
Total CHOL/HDL Ratio: 2
Triglycerides: 41 mg/dL (ref 0.0–149.0)
VLDL: 8.2 mg/dL (ref 0.0–40.0)

## 2021-05-27 MED ORDER — AMLODIPINE BESYLATE 10 MG PO TABS: 10.0000 mg | ORAL_TABLET | Freq: Every day | ORAL | 3 refills | Status: DC

## 2021-05-27 MED ORDER — POTASSIUM CHLORIDE CRYS ER 20 MEQ PO TBCR: 20.0000 meq | EXTENDED_RELEASE_TABLET | Freq: Every day | ORAL | 3 refills | Status: DC

## 2021-05-27 MED ORDER — HYDROCHLOROTHIAZIDE 25 MG PO TABS: 25.0000 mg | ORAL_TABLET | Freq: Every day | ORAL | 3 refills | Status: DC

## 2021-05-27 NOTE — Addendum Note (Signed)
Addended by: Marcina Millard on: 05/27/2021 05:05 PM   Modules accepted: Orders

## 2021-05-27 NOTE — Assessment & Plan Note (Signed)
Chronic Likely related to hctz cmp Continue Klor-Con 20 meq daily

## 2021-05-27 NOTE — Assessment & Plan Note (Addendum)
Chronic Advised weight loss Discussed regular exercise - goal 30 min 5 days a week Will restart the phentermine this month or next month - that has helped in the past - can extend it for a few months but discussed it was short term only

## 2021-05-27 NOTE — Assessment & Plan Note (Signed)
Chronic BP well controlled Continue amlodipine 10 mg daily, hctz 25 mg daily cmp

## 2021-05-27 NOTE — Assessment & Plan Note (Signed)
Chronic Monitored by Dr Dwyane Dee

## 2021-06-17 ENCOUNTER — Encounter: Payer: Self-pay | Admitting: Internal Medicine

## 2021-06-18 ENCOUNTER — Encounter: Payer: Self-pay | Admitting: Internal Medicine

## 2021-06-18 MED ORDER — PHENTERMINE HCL 37.5 MG PO CAPS: 37.5000 mg | ORAL_CAPSULE | ORAL | 2 refills | Status: DC

## 2021-07-28 ENCOUNTER — Encounter: Payer: Self-pay | Admitting: Internal Medicine

## 2021-08-07 ENCOUNTER — Other Ambulatory Visit: Payer: Self-pay | Admitting: Endocrinology

## 2021-08-07 ENCOUNTER — Other Ambulatory Visit (INDEPENDENT_AMBULATORY_CARE_PROVIDER_SITE_OTHER): Payer: Commercial Managed Care - PPO

## 2021-08-07 ENCOUNTER — Other Ambulatory Visit: Payer: Self-pay

## 2021-08-07 DIAGNOSIS — E042 Nontoxic multinodular goiter: Secondary | ICD-10-CM

## 2021-08-07 LAB — TSH: TSH: 0.37 u[IU]/mL (ref 0.35–5.50)

## 2021-08-07 LAB — T3, FREE: T3, Free: 3.7 pg/mL (ref 2.3–4.2)

## 2021-08-07 LAB — T4, FREE: Free T4: 0.75 ng/dL (ref 0.60–1.60)
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
## 2021-08-12 ENCOUNTER — Encounter: Payer: Self-pay | Admitting: Endocrinology

## 2021-08-12 ENCOUNTER — Ambulatory Visit (INDEPENDENT_AMBULATORY_CARE_PROVIDER_SITE_OTHER): Payer: Commercial Managed Care - PPO | Admitting: Endocrinology

## 2021-08-12 ENCOUNTER — Other Ambulatory Visit: Payer: Self-pay

## 2021-08-12 VITALS — BP 124/80 | HR 90 | Ht 70.0 in | Wt 215.8 lb

## 2021-08-12 DIAGNOSIS — E042 Nontoxic multinodular goiter: Secondary | ICD-10-CM | POA: Diagnosis not present

## 2021-09-29 LAB — HM MAMMOGRAPHY

## 2021-09-30 NOTE — Progress Notes (Signed)
? ? ?Subjective:  ? ? Patient ID: Chloe Elliott, female    DOB: 23-Mar-1981, 41 y.o.   MRN: [id number] ? ?This visit occurred during the SARS-CoV-2 public health emergency.  Safety protocols were in place, including screening questions prior to the visit, additional usage of staff PPE, and extensive cleaning of exam room while observing appropriate contact time as indicated for disinfecting solutions. ? ? ? ?HPI ?Chloe Elliott is here for  ?Chief Complaint  ?Patient presents with  ? Follow-up  ? ? ?Left breast discharge - for over one week she feels pain in her left breast - feels like she needs to pump.  At nipple region it Royer Cristobal.  She saw her gyn last Thursday and breast exam was normal.  Her mammogram was done two days ago- she had nipple leakage after the mammogram.  It does not leak spontaneously but with pressure there is nipple discharge.  She has soreness in beast in around 7 o'clock region of the breast ? ?Right breast is ok - no symptoms.   ? ? ?No chance of pregnancy.  Has IUD.  Still having menses.  ? ? ? ? ? ?Medications and allergies reviewed with patient and updated if appropriate. ? ?Current Outpatient Medications on File Prior to Visit  ?Medication Sig Dispense Refill  ? amLODipine (NORVASC) 10 MG tablet Take 1 tablet (10 mg total) by mouth daily. 90 tablet 3  ? hydrochlorothiazide (HYDRODIURIL) 25 MG tablet Take 1 tablet (25 mg total) by mouth daily. 90 tablet 3  ? Multiple Vitamins-Minerals (WOMENS MULTI PO) Take by mouth.    ? potassium chloride SA (KLOR-CON M20) 20 MEQ tablet Take 1 tablet (20 mEq total) by mouth daily. 90 tablet 3  ? ibuprofen (ADVIL) 600 MG tablet Take 1 tablet (600 mg total) by mouth every 8 (eight) hours as needed (with food). (Patient not taking: Reported on 08/12/2021) 21 tablet 0  ? phentermine 37.5 MG capsule Take 1 capsule (37.5 mg total) by mouth every morning. (Patient not taking: Reported on 10/01/2021) 30 capsule 2  ? ?No current facility-administered medications on file prior to  visit.  ? ? ?Review of Systems  ?Constitutional:  Negative for fever.  ?Eyes:  Negative for visual disturbance.  ?Respiratory:  Negative for cough, shortness of breath and wheezing.   ?Cardiovascular:  Negative for chest pain and palpitations.  ?Gastrointestinal:  Negative for nausea.  ?Genitourinary:  Negative for menstrual problem.  ?Neurological:  Negative for dizziness, light-headedness and headaches.  ?Psychiatric/Behavioral:  Negative for dysphoric mood. The patient is not nervous/anxious.   ? ?   ?Objective:  ? ?Vitals:  ? 10/01/21 0750  ?BP: 110/80  ?Pulse: 72  ?Temp: 98.3 ?F (36.8 ?C)  ?SpO2: 98%  ? ?BP Readings from Last 3 Encounters:  ?10/01/21 110/80  ?08/12/21 124/80  ?05/27/21 112/78  ? ?Wt Readings from Last 3 Encounters:  ?10/01/21 219 lb (99.3 kg)  ?08/12/21 215 lb 12.8 oz (97.9 kg)  ?05/27/21 214 lb (97.1 kg)  ? ?Body mass index is 31.42 kg/m?. ? ?  ?Physical Exam ?Constitutional:   ?   Appearance: Normal appearance.  ?HENT:  ?   Head: Normocephalic.  ?Chest:  ?   Comments: Breast exam deferred-just had breast exam by her gynecologist within the last week ?Skin: ?   General: Skin is warm and dry.  ?Neurological:  ?   Mental Status: She is alert.  ? ?   ? ? ? ? ? ?Assessment & Plan:  ? ? ?See  Problem List for Assessment and Plan of chronic medical problems.  ? ? ? ? ?

## 2021-10-01 ENCOUNTER — Encounter: Payer: Self-pay | Admitting: Internal Medicine

## 2021-10-01 ENCOUNTER — Ambulatory Visit (INDEPENDENT_AMBULATORY_CARE_PROVIDER_SITE_OTHER): Payer: Commercial Managed Care - PPO | Admitting: Internal Medicine

## 2021-10-01 VITALS — BP 110/80 | HR 72 | Temp 98.3°F | Ht 70.0 in | Wt 219.0 lb

## 2021-10-01 DIAGNOSIS — E042 Nontoxic multinodular goiter: Secondary | ICD-10-CM

## 2021-10-01 DIAGNOSIS — N6452 Nipple discharge: Secondary | ICD-10-CM | POA: Diagnosis not present

## 2021-10-01 LAB — COMPREHENSIVE METABOLIC PANEL
ALT: 18 U/L (ref 0–35)
AST: 21 U/L (ref 0–37)
Albumin: 4.2 g/dL (ref 3.5–5.2)
Alkaline Phosphatase: 72 U/L (ref 39–117)
BUN: 14 mg/dL (ref 6–23)
CO2: 31 mEq/L (ref 19–32)
Calcium: 9.4 mg/dL (ref 8.4–10.5)
Chloride: 101 mEq/L (ref 96–112)
Creatinine, Ser: 0.79 mg/dL (ref 0.40–1.20)
GFR: 91.86 mL/min (ref >60.00)
Glucose, Bld: 87 mg/dL (ref 70–99)
Potassium: 3.6 mEq/L (ref 3.5–5.1)
Sodium: 138 mEq/L (ref 135–145)
Total Bilirubin: 0.5 mg/dL (ref 0.2–1.2)
Total Protein: 8.1 g/dL (ref 6.0–8.3)

## 2021-10-01 LAB — HCG, QUANTITATIVE, PREGNANCY: Quantitative HCG: <0.6 m[IU]/mL

## 2021-10-01 NOTE — Patient Instructions (Addendum)
? ? ? ?  Blood work was ordered.   ? ? ?Breast ultrasound ordered for [facility].   ? ? ? ?A referral was ordered for surgery.     Someone from that office will call you to schedule an appointment.  ? ? ?

## 2021-10-01 NOTE — Assessment & Plan Note (Signed)
Acute ?Left breast tenderness and discharge from nipple, which started just over a week ago ?Breast exam done by her gynecologist last week, diagnostic mammogram done 2 days ago and was normal ?Will check prolactin, CMP, hCG, but she has an IUD so is not pregnant ?Will order ultrasound of left breast, which was sent to [facility], but they stated she did not need this time so we will not be able to get that done ?Refer to surgery for their opinion ??  Need MRI of breast-we will see what blood work shows first ?

## 2021-10-02 ENCOUNTER — Encounter: Payer: Self-pay | Admitting: Internal Medicine

## 2021-10-02 ENCOUNTER — Telehealth: Payer: Self-pay | Admitting: Internal Medicine

## 2021-10-02 LAB — PROLACTIN: Prolactin: 6.3 ng/mL

## 2021-10-02 NOTE — Telephone Encounter (Signed)
Spoke with patient today. 

## 2021-10-02 NOTE — Progress Notes (Signed)
Outside notes received. Information abstracted. Notes sent to scan.  

## 2021-10-02 NOTE — Telephone Encounter (Signed)
Her prolactin levels in the normal range.  Her kidney function and liver tests are normal.  Her pregnancy test of course is negative. ? ? ?Because the doctor at [facility] did not feel that she needed an ultrasound at the time of her mammogram they do not feel that she needs one now. ? ?I do not think I will be able to get the MRI covered so we need to wait for surgery to help [place] evaluate this further.  The referral for surgery has been ordered and they will call her. ?

## 2021-10-07 ENCOUNTER — Encounter: Payer: Self-pay | Admitting: Internal Medicine

## 2021-10-09 ENCOUNTER — Encounter: Payer: Self-pay | Admitting: Internal Medicine

## 2021-10-09 NOTE — Progress Notes (Signed)
Outside notes received. Information abstracted. Notes sent to scan.  

## 2021-10-17 ENCOUNTER — Ambulatory Visit: Payer: Self-pay | Admitting: Surgery

## 2021-10-17 ENCOUNTER — Other Ambulatory Visit: Payer: Self-pay | Admitting: Surgery

## 2021-10-17 DIAGNOSIS — N644 Mastodynia: Secondary | ICD-10-CM | POA: Insufficient documentation

## 2021-10-17 DIAGNOSIS — N6452 Nipple discharge: Secondary | ICD-10-CM

## 2021-11-05 ENCOUNTER — Ambulatory Visit
Admission: RE | Admit: 2021-11-05 | Discharge: 2021-11-05 | Disposition: A | Discharge status: Home / Self Care | Payer: Commercial Managed Care - PPO | Source: Ambulatory Visit | Attending: Surgery | Admitting: Surgery

## 2021-11-05 DIAGNOSIS — N644 Mastodynia: Secondary | ICD-10-CM

## 2021-11-05 MED ORDER — GADOBUTROL 1 MMOL/ML IV SOLN
10.0000 mL | Freq: Once | INTRAVENOUS | Status: AC | PRN
  Administered 2021-11-05: 10 mL via INTRAVENOUS

## 2021-11-07 ENCOUNTER — Other Ambulatory Visit: Payer: Self-pay | Admitting: Surgery

## 2021-11-07 DIAGNOSIS — N6342 Unspecified lump in left breast, subareolar: Secondary | ICD-10-CM

## 2021-11-07 DIAGNOSIS — N6452 Nipple discharge: Secondary | ICD-10-CM

## 2021-11-10 ENCOUNTER — Other Ambulatory Visit: Payer: Self-pay | Admitting: Surgery

## 2021-11-10 DIAGNOSIS — N6342 Unspecified lump in left breast, subareolar: Secondary | ICD-10-CM

## 2021-11-10 DIAGNOSIS — N6452 Nipple discharge: Secondary | ICD-10-CM

## 2021-11-14 ENCOUNTER — Ambulatory Visit
Admission: RE | Admit: 2021-11-14 | Discharge: 2021-11-14 | Disposition: A | Discharge status: Home / Self Care | Payer: Commercial Managed Care - PPO | Source: Ambulatory Visit | Attending: Surgery | Admitting: Surgery

## 2021-11-14 DIAGNOSIS — N6452 Nipple discharge: Secondary | ICD-10-CM

## 2021-11-14 DIAGNOSIS — N6342 Unspecified lump in left breast, subareolar: Secondary | ICD-10-CM

## 2021-11-14 MED ORDER — GADOBUTROL 1 MMOL/ML IV SOLN
10.0000 mL | Freq: Once | INTRAVENOUS | Status: AC | PRN
  Administered 2021-11-14: 10 mL via INTRAVENOUS

## 2021-11-17 ENCOUNTER — Ambulatory Visit: Payer: Self-pay | Admitting: Surgery

## 2021-11-17 DIAGNOSIS — D242 Benign neoplasm of left breast: Secondary | ICD-10-CM
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
## 2021-11-19 ENCOUNTER — Other Ambulatory Visit: Payer: Self-pay | Admitting: Surgery

## 2021-11-19 DIAGNOSIS — D242 Benign neoplasm of left breast: Secondary | ICD-10-CM

## 2021-11-21 ENCOUNTER — Other Ambulatory Visit: Payer: Self-pay

## 2021-11-21 ENCOUNTER — Encounter (HOSPITAL_BASED_OUTPATIENT_CLINIC_OR_DEPARTMENT_OTHER): Payer: Self-pay | Admitting: Surgery

## 2021-11-23 ENCOUNTER — Encounter: Payer: Self-pay | Admitting: Internal Medicine

## 2021-11-23 NOTE — Patient Instructions (Addendum)
       Medications changes include :   increase potassium to 2 pills daily   Your prescription(s) have been sent to your pharmacy.      Return in about 6 months (around 06/10/2022) for Physical Exam.

## 2021-11-23 NOTE — Progress Notes (Signed)
Subjective:    Patient ID: Chloe Elliott, female    DOB: [birth year], 41 y.o.   MRN: [id number]     HPI Chloe Elliott is here for follow up of her chronic medical problems, including htn, hypokalemia    She has no energy and her weight keeps going.  She is walking 30 minutes twice a week.    Recent labs - potassium was low   Medications and allergies reviewed with patient and updated if appropriate.  Current Outpatient Medications on File Prior to Visit  Medication Sig Dispense Refill   amLODipine (NORVASC) 10 MG tablet Take 1 tablet (10 mg total) by mouth daily. 90 tablet 3   hydrochlorothiazide (HYDRODIURIL) 25 MG tablet Take 1 tablet (25 mg total) by mouth daily. 90 tablet 3   Multiple Vitamins-Minerals (WOMENS MULTI PO) Take by mouth.     No current facility-administered medications on file prior to visit.     Review of Systems  Constitutional:  Negative for fever.  Respiratory:  Negative for cough, shortness of breath and wheezing.   Cardiovascular:  Negative for chest pain, palpitations and leg swelling.  Neurological:  Negative for light-headedness and headaches.       Objective:   Vitals:   11/27/21 0759  BP: 122/80  Pulse: 71  Temp: 98.4 F (36.9 C)  SpO2: 97%   BP Readings from Last 3 Encounters:  11/27/21 122/80  10/01/21 110/80  08/12/21 124/80   Wt Readings from Last 3 Encounters:  11/27/21 222 lb (100.7 kg)  10/01/21 219 lb (99.3 kg)  08/12/21 215 lb 12.8 oz (97.9 kg)   Body mass index is 31.85 kg/m.    Physical Exam Constitutional:      General: She is not in acute distress.    Appearance: Normal appearance.  HENT:     Head: Normocephalic and atraumatic.  Eyes:     Conjunctiva/sclera: Conjunctivae normal.  Cardiovascular:     Rate and Rhythm: Normal rate and regular rhythm.     Heart sounds: Normal heart sounds. No murmur heard. Pulmonary:     Effort: Pulmonary effort is normal. No respiratory distress.     Breath sounds: Normal  breath sounds. No wheezing.  Musculoskeletal:     Cervical back: Neck supple.     Right lower leg: No edema.     Left lower leg: No edema.  Lymphadenopathy:     Cervical: No cervical adenopathy.  Skin:    General: Skin is warm and dry.     Findings: No rash.  Neurological:     Mental Status: She is alert. Mental status is at baseline.  Psychiatric:        Mood and Affect: Mood normal.        Behavior: Behavior normal.        Lab Results  Component Value Date   WBC 3.9 (L) 05/27/2021   HGB 13.2 05/27/2021   HCT 38.7 05/27/2021   PLT 289.0 05/27/2021   GLUCOSE 83 11/25/2021   CHOL 134 05/27/2021   TRIG 41.0 05/27/2021   HDL 59.10 05/27/2021   LDLCALC 66 05/27/2021   ALT 18 10/01/2021   AST 21 10/01/2021   NA 140 11/25/2021   K 3.3 (L) 11/25/2021   CL 106 11/25/2021   CREATININE 0.84 11/25/2021   BUN 9 11/25/2021   CO2 25 11/25/2021   TSH 0.37 08/07/2021   HGBA1C 5.3 05/24/2020     Assessment & Plan:    See Problem List  for Assessment and Plan of chronic medical problems.

## 2021-11-25 ENCOUNTER — Encounter (HOSPITAL_BASED_OUTPATIENT_CLINIC_OR_DEPARTMENT_OTHER)
Admission: RE | Admit: 2021-11-25 | Discharge: 2021-11-25 | Disposition: A | Discharge status: Home / Self Care | Payer: Commercial Managed Care - PPO | Source: Ambulatory Visit | Attending: Surgery | Admitting: Surgery

## 2021-11-25 DIAGNOSIS — Z01818 Encounter for other preprocedural examination: Secondary | ICD-10-CM | POA: Insufficient documentation

## 2021-11-25 DIAGNOSIS — I10 Essential (primary) hypertension: Secondary | ICD-10-CM | POA: Insufficient documentation

## 2021-11-25 LAB — BASIC METABOLIC PANEL
Anion gap: 9 (ref 5–15)
BUN: 9 mg/dL (ref 6–20)
CO2: 25 mmol/L (ref 22–32)
Calcium: 9.2 mg/dL (ref 8.9–10.3)
Chloride: 106 mmol/L (ref 98–111)
Creatinine, Ser: 0.84 mg/dL (ref 0.44–1.00)
GFR, Estimated: >60 mL/min (ref >60)
Glucose, Bld: 83 mg/dL (ref 70–99)
Potassium: 3.3 mmol/L — ABNORMAL LOW (ref 3.5–5.1)
Sodium: 140 mmol/L (ref 135–145)

## 2021-11-25 NOTE — Progress Notes (Signed)

## 2021-11-27 ENCOUNTER — Ambulatory Visit (INDEPENDENT_AMBULATORY_CARE_PROVIDER_SITE_OTHER): Payer: Commercial Managed Care - PPO | Admitting: Internal Medicine

## 2021-11-27 VITALS — BP 122/80 | HR 71 | Temp 98.4°F | Ht 70.0 in | Wt 222.0 lb

## 2021-11-27 DIAGNOSIS — E538 Deficiency of other specified B group vitamins: Secondary | ICD-10-CM | POA: Insufficient documentation

## 2021-11-27 DIAGNOSIS — E876 Hypokalemia: Secondary | ICD-10-CM | POA: Diagnosis not present

## 2021-11-27 DIAGNOSIS — I10 Essential (primary) hypertension: Secondary | ICD-10-CM | POA: Diagnosis not present

## 2021-11-27 MED ORDER — POTASSIUM CHLORIDE CRYS ER 20 MEQ PO TBCR: 40.0000 meq | EXTENDED_RELEASE_TABLET | Freq: Every day | ORAL | 3 refills | Status: DC

## 2021-11-27 NOTE — Assessment & Plan Note (Addendum)
H/o B12  Has increased fatigue Can consider monthly B12 injections in the future

## 2021-11-27 NOTE — Assessment & Plan Note (Signed)
Chronic Blood pressure well controlled BMP Continue amlodipine 10 mg daily, HCTZ 25 mg daily

## 2021-11-27 NOTE — Assessment & Plan Note (Signed)
Chronic Related to hydrochlorothiazide Continue Klor-Con, but increase to 40 mg daily

## 2021-12-01 ENCOUNTER — Ambulatory Visit
Admission: RE | Admit: 2021-12-01 | Discharge: 2021-12-01 | Disposition: A | Discharge status: Home / Self Care | Payer: Commercial Managed Care - PPO | Source: Ambulatory Visit | Attending: Surgery | Admitting: Surgery

## 2021-12-01 DIAGNOSIS — D242 Benign neoplasm of left breast: Secondary | ICD-10-CM

## 2021-12-02 ENCOUNTER — Ambulatory Visit
Admission: RE | Admit: 2021-12-02 | Discharge: 2021-12-02 | Disposition: A | Discharge status: Home / Self Care | Payer: Commercial Managed Care - PPO | Source: Ambulatory Visit | Attending: Surgery | Admitting: Surgery

## 2021-12-02 DIAGNOSIS — D242 Benign neoplasm of left breast: Secondary | ICD-10-CM

## 2021-12-02 DIAGNOSIS — Z01818 Encounter for other preprocedural examination: Secondary | ICD-10-CM

## 2021-12-02 DIAGNOSIS — I10 Essential (primary) hypertension: Secondary | ICD-10-CM

## 2021-12-04 ENCOUNTER — Other Ambulatory Visit: Payer: Self-pay

## 2021-12-04 ENCOUNTER — Ambulatory Visit: Admission: RE | Admit: 2021-12-04 | Payer: Commercial Managed Care - PPO | Source: Ambulatory Visit

## 2021-12-04 ENCOUNTER — Encounter (HOSPITAL_COMMUNITY): Payer: Self-pay | Admitting: Surgery

## 2021-12-04 DIAGNOSIS — I10 Essential (primary) hypertension: Secondary | ICD-10-CM

## 2021-12-04 DIAGNOSIS — Z01818 Encounter for other preprocedural examination: Secondary | ICD-10-CM

## 2021-12-04 NOTE — Progress Notes (Addendum)
Chloe Elliott denies chest pain or shortness of breath. Patient denies having any s/s of Covid in her household.  Patient denies any known exposure to Covid.   Chloe Elliott's PCP is Dr Billey Gosling, patient was seen in the office on 11/27/21.

## 2021-12-05 ENCOUNTER — Ambulatory Visit (HOSPITAL_COMMUNITY): Payer: Commercial Managed Care - PPO | Admitting: Certified Registered Nurse Anesthetist

## 2021-12-05 ENCOUNTER — Encounter (HOSPITAL_COMMUNITY): Payer: Self-pay | Admitting: Surgery

## 2021-12-05 ENCOUNTER — Ambulatory Visit (HOSPITAL_COMMUNITY)
Admission: RE | Admit: 2021-12-05 | Discharge: 2021-12-05 | Disposition: A | Discharge status: Home / Self Care | Payer: Commercial Managed Care - PPO | Attending: Surgery | Admitting: Surgery

## 2021-12-05 ENCOUNTER — Encounter (HOSPITAL_COMMUNITY)
Admission: RE | Disposition: A | Discharge status: Home / Self Care | Payer: Self-pay | Source: Home / Self Care | Attending: Surgery

## 2021-12-05 ENCOUNTER — Ambulatory Visit
Admission: RE | Admit: 2021-12-05 | Discharge: 2021-12-05 | Disposition: A | Discharge status: Home / Self Care | Payer: Commercial Managed Care - PPO | Source: Ambulatory Visit | Attending: Surgery | Admitting: Surgery

## 2021-12-05 ENCOUNTER — Other Ambulatory Visit: Payer: Self-pay

## 2021-12-05 ENCOUNTER — Ambulatory Visit (HOSPITAL_BASED_OUTPATIENT_CLINIC_OR_DEPARTMENT_OTHER): Payer: Commercial Managed Care - PPO | Admitting: Certified Registered Nurse Anesthetist

## 2021-12-05 DIAGNOSIS — N62 Hypertrophy of breast: Secondary | ICD-10-CM | POA: Insufficient documentation

## 2021-12-05 DIAGNOSIS — D242 Benign neoplasm of left breast: Secondary | ICD-10-CM

## 2021-12-05 DIAGNOSIS — Z01818 Encounter for other preprocedural examination: Secondary | ICD-10-CM

## 2021-12-05 DIAGNOSIS — I10 Essential (primary) hypertension: Secondary | ICD-10-CM | POA: Insufficient documentation

## 2021-12-05 HISTORY — DX: Essential (primary) hypertension: I10

## 2021-12-05 HISTORY — PX: BREAST LUMPECTOMY WITH RADIOACTIVE SEED LOCALIZATION: SHX6424

## 2021-12-05 LAB — CBC
HCT: 40.2 % (ref 36.0–46.0)
Hemoglobin: 13.9 g/dL (ref 12.0–15.0)
MCH: 31.4 pg (ref 26.0–34.0)
MCHC: 34.6 g/dL (ref 30.0–36.0)
MCV: 90.7 fL (ref 80.0–100.0)
Platelets: 244 10*3/uL (ref 150–400)
RBC: 4.43 MIL/uL (ref 3.87–5.11)
RDW: 11.9 % (ref 11.5–15.5)
WBC: 5.3 10*3/uL (ref 4.0–10.5)
nRBC: 0 % (ref 0.0–0.2)

## 2021-12-05 LAB — POCT PREGNANCY, URINE: Preg Test, Ur: NEGATIVE

## 2021-12-05 SURGERY — BREAST LUMPECTOMY WITH RADIOACTIVE SEED LOCALIZATION
Anesthesia: General | Site: Breast | Laterality: Left

## 2021-12-05 MED ORDER — OXYCODONE HCL 5 MG PO TABS: 5.0000 mg | ORAL_TABLET | Freq: Once | ORAL | Status: AC | PRN

## 2021-12-05 MED ORDER — ORAL CARE MOUTH RINSE: 15.0000 mL | Freq: Once | OROMUCOSAL | Status: AC

## 2021-12-05 MED ORDER — ACETAMINOPHEN 10 MG/ML IV SOLN: 1000.0000 mg | Freq: Once | INTRAVENOUS | Status: DC | PRN

## 2021-12-05 MED ORDER — DEXAMETHASONE SODIUM PHOSPHATE 10 MG/ML IJ SOLN
INTRAMUSCULAR | Status: AC
  Filled 2021-12-05: qty 1

## 2021-12-05 MED ORDER — ACETAMINOPHEN 160 MG/5ML PO SOLN: 1000.0000 mg | Freq: Once | ORAL | Status: DC | PRN

## 2021-12-05 MED ORDER — LACTATED RINGERS IV SOLN: INTRAVENOUS | Status: DC

## 2021-12-05 MED ORDER — OXYCODONE HCL 5 MG PO TABS
ORAL_TABLET | ORAL | Status: AC
  Administered 2021-12-05: 5 mg via ORAL
  Filled 2021-12-05: qty 1

## 2021-12-05 MED ORDER — ACETAMINOPHEN 500 MG PO TABS
1000.0000 mg | ORAL_TABLET | ORAL | Status: AC
  Administered 2021-12-05: 1000 mg via ORAL
  Filled 2021-12-05: qty 2

## 2021-12-05 MED ORDER — DEXAMETHASONE SODIUM PHOSPHATE 10 MG/ML IJ SOLN
INTRAMUSCULAR | Status: DC | PRN
  Administered 2021-12-05: 5 mg via INTRAVENOUS

## 2021-12-05 MED ORDER — FENTANYL CITRATE (PF) 250 MCG/5ML IJ SOLN
INTRAMUSCULAR | Status: AC
  Filled 2021-12-05: qty 5

## 2021-12-05 MED ORDER — MIDAZOLAM HCL 2 MG/2ML IJ SOLN
INTRAMUSCULAR | Status: DC | PRN
  Administered 2021-12-05: 2 mg via INTRAVENOUS

## 2021-12-05 MED ORDER — ONDANSETRON HCL 4 MG/2ML IJ SOLN
INTRAMUSCULAR | Status: DC | PRN
  Administered 2021-12-05: 4 mg via INTRAVENOUS

## 2021-12-05 MED ORDER — BUPIVACAINE-EPINEPHRINE 0.25% -1:200000 IJ SOLN
INTRAMUSCULAR | Status: DC | PRN
  Administered 2021-12-05: 10 mL

## 2021-12-05 MED ORDER — CEFAZOLIN SODIUM-DEXTROSE 2-4 GM/100ML-% IV SOLN
2.0000 g | INTRAVENOUS | Status: AC
  Administered 2021-12-05: 2 g via INTRAVENOUS
  Filled 2021-12-05: qty 100

## 2021-12-05 MED ORDER — CHLORHEXIDINE GLUCONATE CLOTH 2 % EX PADS: 6.0000 | MEDICATED_PAD | Freq: Once | CUTANEOUS | Status: DC

## 2021-12-05 MED ORDER — BUPIVACAINE-EPINEPHRINE (PF) 0.25% -1:200000 IJ SOLN
INTRAMUSCULAR | Status: AC
Start: 2021-12-05 — End: ?
  Filled 2021-12-05: qty 30

## 2021-12-05 MED ORDER — KETOROLAC TROMETHAMINE 30 MG/ML IJ SOLN
INTRAMUSCULAR | Status: AC
Start: 2021-12-05 — End: ?
  Filled 2021-12-05: qty 1

## 2021-12-05 MED ORDER — PROPOFOL 10 MG/ML IV BOLUS
INTRAVENOUS | Status: DC | PRN
  Administered 2021-12-05: 200 mg via INTRAVENOUS

## 2021-12-05 MED ORDER — ONDANSETRON HCL 4 MG/2ML IJ SOLN
INTRAMUSCULAR | Status: AC
  Filled 2021-12-05: qty 2

## 2021-12-05 MED ORDER — LIDOCAINE 2% (20 MG/ML) 5 ML SYRINGE
INTRAMUSCULAR | Status: AC
  Filled 2021-12-05: qty 5

## 2021-12-05 MED ORDER — KETOROLAC TROMETHAMINE 30 MG/ML IJ SOLN
INTRAMUSCULAR | Status: DC | PRN
  Administered 2021-12-05: 30 mg via INTRAVENOUS

## 2021-12-05 MED ORDER — CHLORHEXIDINE GLUCONATE 0.12 % MT SOLN
15.0000 mL | Freq: Once | OROMUCOSAL | Status: AC
  Administered 2021-12-05: 15 mL via OROMUCOSAL
  Filled 2021-12-05: qty 15

## 2021-12-05 MED ORDER — ACETAMINOPHEN 500 MG PO TABS: 1000.0000 mg | ORAL_TABLET | Freq: Once | ORAL | Status: DC | PRN

## 2021-12-05 MED ORDER — PROPOFOL 500 MG/50ML IV EMUL
INTRAVENOUS | Status: DC | PRN
  Administered 2021-12-05: 100 ug/kg/min via INTRAVENOUS

## 2021-12-05 MED ORDER — LIDOCAINE 2% (20 MG/ML) 5 ML SYRINGE
INTRAMUSCULAR | Status: DC | PRN
  Administered 2021-12-05: 80 mg via INTRAVENOUS

## 2021-12-05 MED ORDER — 0.9 % SODIUM CHLORIDE (POUR BTL) OPTIME
TOPICAL | Status: DC | PRN
  Administered 2021-12-05: 1000 mL

## 2021-12-05 MED ORDER — FENTANYL CITRATE (PF) 100 MCG/2ML IJ SOLN
25.0000 ug | INTRAMUSCULAR | Status: DC | PRN
  Administered 2021-12-05: 50 ug via INTRAVENOUS

## 2021-12-05 MED ORDER — FENTANYL CITRATE (PF) 100 MCG/2ML IJ SOLN
INTRAMUSCULAR | Status: AC
  Administered 2021-12-05: 50 ug via INTRAVENOUS
  Filled 2021-12-05: qty 2

## 2021-12-05 MED ORDER — MIDAZOLAM HCL 2 MG/2ML IJ SOLN
INTRAMUSCULAR | Status: AC
  Filled 2021-12-05: qty 2

## 2021-12-05 MED ORDER — OXYCODONE HCL 5 MG/5ML PO SOLN: 5.0000 mg | Freq: Once | ORAL | Status: AC | PRN

## 2021-12-05 MED ORDER — FENTANYL CITRATE (PF) 250 MCG/5ML IJ SOLN
INTRAMUSCULAR | Status: DC | PRN
Start: 2021-12-05 — End: 2021-12-05
  Administered 2021-12-05: 25 ug via INTRAVENOUS
  Administered 2021-12-05: 50 ug via INTRAVENOUS

## 2021-12-05 SURGICAL SUPPLY — 46 items
APL PRP STRL LF DISP 70% ISPRP (MISCELLANEOUS) ×1
APL SKNCLS STERI-STRIP NONHPOA (GAUZE/BANDAGES/DRESSINGS) ×1
APPLIER CLIP 9.375 MED OPEN (MISCELLANEOUS)
APR CLP MED 9.3 20 MLT OPN (MISCELLANEOUS)
BAG COUNTER SPONGE SURGICOUNT (BAG) ×2 IMPLANT
BAG SPNG CNTER NS LX DISP (BAG) ×1
BENZOIN TINCTURE PRP APPL 2/3 (GAUZE/BANDAGES/DRESSINGS) ×2 IMPLANT
BINDER BREAST LRG (GAUZE/BANDAGES/DRESSINGS) IMPLANT
BINDER BREAST XLRG (GAUZE/BANDAGES/DRESSINGS) IMPLANT
BINDER BREAST XXLRG (GAUZE/BANDAGES/DRESSINGS) ×1 IMPLANT
CANISTER SUCT 3000ML PPV (MISCELLANEOUS) ×2 IMPLANT
CHLORAPREP W/TINT 26 (MISCELLANEOUS) ×2 IMPLANT
CLIP APPLIE 9.375 MED OPEN (MISCELLANEOUS) IMPLANT
COVER PROBE W GEL 5X96 (DRAPES) ×2 IMPLANT
COVER SURGICAL LIGHT HANDLE (MISCELLANEOUS) ×2 IMPLANT
DEVICE DUBIN SPECIMEN MAMMOGRA (MISCELLANEOUS) ×2 IMPLANT
DRAPE CHEST BREAST 15X10 FENES (DRAPES) ×2 IMPLANT
DRSG TEGADERM 4X4.75 (GAUZE/BANDAGES/DRESSINGS) ×2 IMPLANT
ELECT CAUTERY BLADE 6.4 (BLADE) ×2 IMPLANT
ELECT REM PT RETURN 9FT ADLT (ELECTROSURGICAL) ×2
ELECTRODE REM PT RTRN 9FT ADLT (ELECTROSURGICAL) ×1 IMPLANT
GAUZE SPONGE 2X2 8PLY STRL LF (GAUZE/BANDAGES/DRESSINGS) ×1 IMPLANT
GAUZE SPONGE 4X4 12PLY STRL (GAUZE/BANDAGES/DRESSINGS) ×1 IMPLANT
GLOVE BIO SURGEON STRL SZ7 (GLOVE) ×2 IMPLANT
GLOVE BIOGEL PI IND STRL 7.5 (GLOVE) ×1 IMPLANT
GLOVE BIOGEL PI INDICATOR 7.5 (GLOVE) ×1
GOWN STRL REUS W/ TWL LRG LVL3 (GOWN DISPOSABLE) ×2 IMPLANT
GOWN STRL REUS W/TWL LRG LVL3 (GOWN DISPOSABLE) ×4
ILLUMINATOR WAVEGUIDE N/F (MISCELLANEOUS) IMPLANT
KIT BASIN OR (CUSTOM PROCEDURE TRAY) ×2 IMPLANT
KIT MARKER MARGIN INK (KITS) ×2 IMPLANT
LIGHT WAVEGUIDE WIDE FLAT (MISCELLANEOUS) IMPLANT
NDL HYPO 25GX1X1/2 BEV (NEEDLE) ×1 IMPLANT
NEEDLE HYPO 25GX1X1/2 BEV (NEEDLE) ×2 IMPLANT
NS IRRIG 1000ML POUR BTL (IV SOLUTION) ×2 IMPLANT
PACK GENERAL/GYN (CUSTOM PROCEDURE TRAY) ×2 IMPLANT
SPONGE GAUZE 2X2 STER 10/PKG (GAUZE/BANDAGES/DRESSINGS) ×1
SPONGE T-LAP 4X18 ~~LOC~~+RFID (SPONGE) ×2 IMPLANT
STRIP CLOSURE SKIN 1/2X4 (GAUZE/BANDAGES/DRESSINGS) ×2 IMPLANT
SUT MNCRL AB 4-0 PS2 18 (SUTURE) ×2 IMPLANT
SUT SILK 2 0 SH (SUTURE) IMPLANT
SUT VIC AB 3-0 SH 27 (SUTURE) ×2
SUT VIC AB 3-0 SH 27X BRD (SUTURE) ×1 IMPLANT
SYR CONTROL 10ML LL (SYRINGE) ×2 IMPLANT
TOWEL GREEN STERILE (TOWEL DISPOSABLE) ×2 IMPLANT
TOWEL GREEN STERILE FF (TOWEL DISPOSABLE) ×2 IMPLANT

## 2021-12-05 NOTE — Op Note (Signed)
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 Anesthesia:  GEN - LMA Indications:  This is a 41 year old female with hypertension and a family history of breast cancer in a paternal grandmother who recently began experiencing some the left breast fullness.  This is all within the last month.  She felt that the left breast fullness and burning felt the same as when she was breast-feeding.  Her youngest child is 43 years old.  She underwent mammogram on 09/29/2021 which was unremarkable.  After the mammogram, she developed intermittent left nipple drainage.  This drainage is yellowish and has not been bloody.  It is only expressed and is not spontaneous.  She still has some of the residual fullness and burning sensation.  She has been referred to [place] to discuss further work-up.  No previous breast biopsies or surgeries.  MRI 11/06/21 showed a focal area of linear non-mass enhancement in the medial anterior left breast.  Biopsy revealed intraductal papilloma with columnar cell change/ apocrine metaplasia.  She presents now for excisional biopsy of this area for definitive diagnosis.  A radioactive seed was placed earlier in the week by radiology.   Description of procedure: The patient is brought to the operating room placed in supine position on the operating room table. After an adequate level of general anesthesia was obtained, her left breast was prepped with ChloraPrep and draped in sterile fashion. A timeout was taken to ensure the proper patient and proper procedure. We interrogated the breast with the neoprobe. We made a circumareolar incision around the upper side of the nipple after infiltrating with 0.25% Marcaine. Dissection was carried down in the breast tissue with cautery. We used the neoprobe to guide [place] towards the radioactive seed. We excised an area  of tissue around the radioactive seed 2 cm in diameter. The specimen was removed and was oriented with a paint kit. Specimen mammogram showed the radioactive seed as well as the biopsy clip within the specimen. This was sent for pathologic examination. There is no residual radioactivity within the biopsy cavity. We inspected carefully for hemostasis. The wound was thoroughly irrigated. The wound was closed with a deep layer of 3-0 Vicryl and a subcuticular layer of 4-0 Monocryl. Benzoin and Steri-Strips were applied. The patient was then extubated and brought to the recovery room in stable condition. All sponge, instrument, and needle counts are correct.  Imogene Burn. Georgette Dover, MD, [facility]  General/ Trauma Surgery  12/05/2021 1:13 PM

## 2021-12-05 NOTE — Discharge Instructions (Signed)
[place] Office Phone Number [phone]  BREAST BIOPSY: POST OP INSTRUCTIONS  Always review your discharge instruction sheet given to you by the facility where your surgery was performed.  IF YOU HAVE DISABILITY OR FAMILY LEAVE FORMS, YOU MUST BRING THEM TO THE OFFICE FOR PROCESSING.  DO NOT GIVE THEM TO YOUR DOCTOR.  You may take acetaminophen (Tylenol) or ibuprofen (Advil) as needed. Take your usually prescribed medications unless otherwise directed You should eat very light the first 24 hours after surgery, such as soup, crackers, pudding, etc.  Resume your normal diet the day after surgery. Most patients will experience some swelling and bruising in the breast.  Ice packs and a good support bra will help.  Swelling and bruising can take several days to resolve.  It is common to experience some constipation after surgery.  Increasing fluid intake and taking a stool softener will usually help or prevent this problem from occurring.  A mild laxative (Milk of Magnesia or Miralax) should be taken according to package directions if there are no bowel movements after 48 hours. Unless discharge instructions indicate otherwise, you may remove your bandages 24-48 hours after surgery, and you may shower at that time.  You may have steri-strips (small skin tapes) in place directly over the incision.  These strips should be left on the skin for 7-10 days.   ACTIVITIES:  You may resume regular daily activities (gradually increasing) beginning the next day.  Wearing a good support bra or sports bra minimizes pain and swelling.  You may have sexual intercourse when it is comfortable. You may drive when you no longer are taking prescription pain medication, you can comfortably wear a seatbelt, and you can safely maneuver your car and apply brakes. RETURN TO WORK:  ______________________________________________________________________________________ Dennis Bast should see your doctor in the office  for a follow-up appointment approximately two weeks after your surgery.  Your doctor's nurse will typically make your follow-up appointment when she calls you with your pathology report.  Expect your pathology report 2-3 business days after your surgery.  You may call to check if you do not hear from [place] after three days. OTHER INSTRUCTIONS: _______________________________________________________________________________________________ _____________________________________________________________________________________________________________________________________ _____________________________________________________________________________________________________________________________________ _____________________________________________________________________________________________________________________________________  WHEN TO CALL YOUR DOCTOR: Fever over 101.0 Nausea and/or vomiting. Extreme swelling or bruising. Continued bleeding from incision. Increased pain, redness, or drainage from the incision.  The clinic staff is available to answer your questions during regular business hours.  Please don't hesitate to call and ask to speak to one of the nurses for clinical concerns.  If you have a medical emergency, go to the nearest emergency room or call 911.  A surgeon from [facility] is always on call at the hospital.  For further questions, please visit [email or web address]

## 2021-12-05 NOTE — Transfer of Care (Signed)
Immediate Anesthesia Transfer of Care Note  Patient: Chloe Elliott  Procedure(s) Performed: LEFT BREAST LUMPECTOMY WITH RADIOACTIVE SEED LOCALIZATION (Left: Breast)  Patient Location: PACU  Anesthesia Type:General  Level of Consciousness: awake, alert  and oriented  Airway & Oxygen Therapy: Patient Spontanous Breathing and Patient connected to nasal cannula oxygen  Post-op Assessment: Report given to RN and Post -op Vital signs reviewed and stable  Post vital signs: Reviewed and stable  Last Vitals:  Vitals Value Taken Time  BP 102/70 12/05/21 1324  Temp    Pulse 77 12/05/21 1326  Resp 17 12/05/21 1326  SpO2 100 % 12/05/21 1326  Vitals shown include unvalidated device data.  Last Pain:  Vitals:   12/05/21 1103  TempSrc:   PainSc: 0-No pain         Complications: No notable events documented.

## 2021-12-05 NOTE — Anesthesia Preprocedure Evaluation (Signed)
Anesthesia Evaluation  Patient identified by MRN, date of birth, ID band Patient awake    Reviewed: Allergy & Precautions, NPO status , Patient's Chart, lab work & pertinent test results  History of Anesthesia Complications Negative for: history of anesthetic complications  Airway Mallampati: II  TM Distance: >3 FB Neck ROM: Full    Dental  (+) Teeth Intact, Dental Advisory Given   Pulmonary neg pulmonary ROS,    breath sounds clear to auscultation       Cardiovascular hypertension, Pt. on medications (-) angina(-) Past MI and (-) CHF  Rhythm:Regular     Neuro/Psych negative neurological ROS  negative psych ROS   GI/Hepatic negative GI ROS, Neg liver ROS,   Endo/Other  negative endocrine ROS  Renal/GU negative Renal ROS     Musculoskeletal negative musculoskeletal ROS (+)   Abdominal   Peds  Hematology negative hematology ROS (+)   Anesthesia Other Findings   Reproductive/Obstetrics Lab Results      Component                Value               Date                      PREGTESTUR               NEGATIVE            12/05/2021                                        Anesthesia Physical Anesthesia Plan  ASA: 2  Anesthesia Plan: General   Post-op Pain Management: Toradol IV (intra-op)* and Tylenol PO (pre-op)*   Induction: Intravenous  PONV Risk Score and Plan: 3 and Ondansetron, Dexamethasone, Propofol infusion and TIVA  Airway Management Planned: LMA  Additional Equipment: None  Intra-op Plan:   Post-operative Plan: Extubation in OR  Informed Consent: I have reviewed the patients History and Physical, chart, labs and discussed the procedure including the risks, benefits and alternatives for the proposed anesthesia with the patient or authorized representative who has indicated his/her understanding and acceptance.     Dental advisory given  Plan Discussed with:  CRNA  Anesthesia Plan Comments:         Anesthesia Quick Evaluation

## 2021-12-05 NOTE — Anesthesia Postprocedure Evaluation (Signed)
Anesthesia Post Note  Patient: Chloe Elliott  Procedure(s) Performed: LEFT BREAST LUMPECTOMY WITH RADIOACTIVE SEED LOCALIZATION (Left: Breast)     Patient location during evaluation: PACU Anesthesia Type: General Level of consciousness: awake and alert Pain management: pain level controlled Vital Signs Assessment: post-procedure vital signs reviewed and stable Respiratory status: spontaneous breathing, nonlabored ventilation and respiratory function stable Cardiovascular status: blood pressure returned to baseline and stable Postop Assessment: no apparent nausea or vomiting Anesthetic complications: no   No notable events documented.  Last Vitals:  Vitals:   12/05/21 1345 12/05/21 1352  BP: 108/71 117/85  Pulse: 64 67  Resp: 18 17  Temp:  36.5 C  SpO2: 100% 96%    Last Pain:  Vitals:   12/05/21 1352  TempSrc:   PainSc: 3                  Zerah Hilyer

## 2021-12-05 NOTE — Interval H&P Note (Signed)
History and Physical Interval Note:  12/05/2021 10:59 AM  Chloe Elliott  has presented today for surgery, with the diagnosis of LEFT BREAST INTRADUCTAL PAPILLOMA.  The various methods of treatment have been discussed with the patient and family. After consideration of risks, benefits and other options for treatment, the patient has consented to  Procedure(s) with comments: LEFT BREAST LUMPECTOMY WITH RADIOACTIVE SEED LOCALIZATION (Left) - LMA as a surgical intervention.  The patient's history has been reviewed, patient examined, no change in status, stable for surgery.  I have reviewed the patient's chart and labs.  Questions were answered to the patient's satisfaction.     Maia Petties

## 2021-12-05 NOTE — Anesthesia Procedure Notes (Signed)
Procedure Name: LMA Insertion Date/Time: 12/05/2021 12:35 PM  Performed by: Imagene Riches, CRNAPre-anesthesia Checklist: Patient identified, Emergency Drugs available, Suction available and Patient being monitored Patient Re-evaluated:Patient Re-evaluated prior to induction Oxygen Delivery Method: Circle System Utilized Preoxygenation: Pre-oxygenation with 100% oxygen Induction Type: IV induction Ventilation: Mask ventilation without difficulty LMA: LMA inserted LMA Size: 4.0 Number of attempts: 1 Placement Confirmation: positive ETCO2 Tube secured with: Tape Dental Injury: Teeth and Oropharynx as per pre-operative assessment

## 2021-12-06 ENCOUNTER — Encounter (HOSPITAL_COMMUNITY): Payer: Self-pay | Admitting: Surgery

## 2021-12-10 LAB — SURGICAL PATHOLOGY

## 2021-12-23 ENCOUNTER — Encounter (HOSPITAL_COMMUNITY): Payer: Self-pay

## 2022-05-11 ENCOUNTER — Encounter: Payer: Self-pay | Admitting: Internal Medicine

## 2022-05-11 ENCOUNTER — Other Ambulatory Visit: Payer: Self-pay

## 2022-05-11 MED ORDER — POTASSIUM CHLORIDE CRYS ER 20 MEQ PO TBCR: 40.0000 meq | EXTENDED_RELEASE_TABLET | Freq: Every day | ORAL | 3 refills | Status: DC

## 2022-06-06 ENCOUNTER — Other Ambulatory Visit: Payer: Self-pay | Admitting: Internal Medicine

## 2022-06-08 ENCOUNTER — Encounter: Payer: Self-pay | Admitting: Internal Medicine
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
## 2022-06-08 NOTE — Patient Instructions (Addendum)
Blood work was ordered.   The lab is on the first floor.    Medications changes include :   wegovy 0.25 mg weekly    Return in about 6 months (around 12/09/2022) for follow up.    Health Maintenance, Female Adopting a healthy lifestyle and getting preventive care are important in promoting health and wellness. Ask your health care provider about: The right schedule for you to have regular tests and exams. Things you can do on your own to prevent diseases and keep yourself healthy. What should I know about diet, weight, and exercise? Eat a healthy diet  Eat a diet that includes plenty of vegetables, fruits, low-fat dairy products, and lean protein. Do not eat a lot of foods that are high in solid fats, added sugars, or sodium. Maintain a healthy weight Body mass index (BMI) is used to identify weight problems. It estimates body fat based on height and weight. Your health care provider can help determine your BMI and help you achieve or maintain a healthy weight. Get regular exercise Get regular exercise. This is one of the most important things you can do for your health. Most adults should: Exercise for at least 150 minutes each week. The exercise should increase your heart rate and make you sweat (moderate-intensity exercise). Do strengthening exercises at least twice a week. This is in addition to the moderate-intensity exercise. Spend less time sitting. Even light physical activity can be beneficial. Watch cholesterol and blood lipids Have your blood tested for lipids and cholesterol at 44 years of age, then have this test every 5 years. Have your cholesterol levels checked more often if: Your lipid or cholesterol levels are high. You are older than 44 years of age. You are at high risk for heart disease. What should I know about cancer screening? Depending on your health history and family history, you may need to have cancer screening at various ages. This may include  screening for: Breast cancer. Cervical cancer. Colorectal cancer. Skin cancer. Lung cancer. What should I know about heart disease, diabetes, and high blood pressure? Blood pressure and heart disease High blood pressure causes heart disease and increases the risk of stroke. This is more likely to develop in people who have high blood pressure readings or are overweight. Have your blood pressure checked: Every 3-5 years if you are 96-27 years of age. Every year if you are 50 years old or older. Diabetes Have regular diabetes screenings. This checks your fasting blood sugar level. Have the screening done: Once every three years after age 56 if you are at a normal weight and have a low risk for diabetes. More often and at a younger age if you are overweight or have a high risk for diabetes. What should I know about preventing infection? Hepatitis B If you have a higher risk for hepatitis B, you should be screened for this virus. Talk with your health care provider to find out if you are at risk for hepatitis B infection. Hepatitis C Testing is recommended for: Everyone born from [birth year] through 1967. Anyone with known risk factors for hepatitis C. Sexually transmitted infections (STIs) Get screened for STIs, including gonorrhea and chlamydia, if: You are sexually active and are younger than 44 years of age. You are older than 44 years of age and your health care provider tells you that you are at risk for this type of infection. Your sexual activity has changed since you were last screened, and  you are at increased risk for chlamydia or gonorrhea. Ask your health care provider if you are at risk. Ask your health care provider about whether you are at high risk for HIV. Your health care provider may recommend a prescription medicine to help prevent HIV infection. If you choose to take medicine to prevent HIV, you should first get tested for HIV. You should then be tested every 3 months for as  long as you are taking the medicine. Pregnancy If you are about to stop having your period (premenopausal) and you may become pregnant, seek counseling before you get pregnant. Take 400 to 800 micrograms (mcg) of folic acid every day if you become pregnant. Ask for birth control (contraception) if you want to prevent pregnancy. Osteoporosis and menopause Osteoporosis is a disease in which the bones lose minerals and strength with aging. This can result in bone fractures. If you are 50 years old or older, or if you are at risk for osteoporosis and fractures, ask your health care provider if you should: Be screened for bone loss. Take a calcium or vitamin D supplement to lower your risk of fractures. Be given hormone replacement therapy (HRT) to treat symptoms of menopause. Follow these instructions at home: Alcohol use Do not drink alcohol if: Your health care provider tells you not to drink. You are pregnant, may be pregnant, or are planning to become pregnant. If you drink alcohol: Limit how much you have to: 0-1 drink a day. Know how much alcohol is in your drink. In the [place] one drink equals one 12 oz bottle of beer (355 mL), one 5 oz glass of wine (148 mL), or one 1 oz glass of hard liquor (44 mL). Lifestyle Do not use any products that contain nicotine or tobacco. These products include cigarettes, chewing tobacco, and vaping devices, such as e-cigarettes. If you need help quitting, ask your health care provider. Do not use street drugs. Do not share needles. Ask your health care provider for help if you need support or information about quitting drugs. General instructions Schedule regular health, dental, and eye exams. Stay current with your vaccines. Tell your health care provider if: You often feel depressed. You have ever been abused or do not feel safe at home. Summary Adopting a healthy lifestyle and getting preventive care are important in promoting health and  wellness. Follow your health care provider's instructions about healthy diet, exercising, and getting tested or screened for diseases. Follow your health care provider's instructions on monitoring your cholesterol and blood pressure. This information is not intended to replace advice given to you by your health care provider. Make sure you discuss any questions you have with your health care provider. Document Revised: 10/14/2020 Document Reviewed: 10/14/2020 Elsevier Patient Education  [employer].

## 2022-06-10 ENCOUNTER — Ambulatory Visit (INDEPENDENT_AMBULATORY_CARE_PROVIDER_SITE_OTHER): Payer: Commercial Managed Care - PPO | Admitting: Internal Medicine

## 2022-06-10 VITALS — BP 112/80 | HR 71 | Temp 98.1°F | Ht 70.0 in | Wt 228.0 lb

## 2022-06-10 DIAGNOSIS — E669 Obesity, unspecified: Secondary | ICD-10-CM

## 2022-06-10 DIAGNOSIS — E538 Deficiency of other specified B group vitamins: Secondary | ICD-10-CM | POA: Diagnosis not present

## 2022-06-10 DIAGNOSIS — R7989 Other specified abnormal findings of blood chemistry: Secondary | ICD-10-CM | POA: Diagnosis not present

## 2022-06-10 DIAGNOSIS — G479 Sleep disorder, unspecified: Secondary | ICD-10-CM

## 2022-06-10 DIAGNOSIS — Z0000 Encounter for general adult medical examination without abnormal findings: Secondary | ICD-10-CM

## 2022-06-10 DIAGNOSIS — I10 Essential (primary) hypertension: Secondary | ICD-10-CM | POA: Diagnosis not present

## 2022-06-10 DIAGNOSIS — E876 Hypokalemia: Secondary | ICD-10-CM | POA: Diagnosis not present

## 2022-06-10 LAB — T3, FREE: T3, Free: 4.1 pg/mL (ref 2.3–4.2)

## 2022-06-10 LAB — CBC WITH DIFFERENTIAL/PLATELET
Basophils Absolute: 0 10*3/uL (ref 0.0–0.1)
Basophils Relative: 0.1 % (ref 0.0–3.0)
Eosinophils Absolute: 0.1 10*3/uL (ref 0.0–0.7)
Eosinophils Relative: 3.1 % (ref 0.0–5.0)
HCT: 40.7 % (ref 36.0–46.0)
Hemoglobin: 13.9 g/dL (ref 12.0–15.0)
Lymphocytes Relative: 38.7 % (ref 12.0–46.0)
Lymphs Abs: 1.7 10*3/uL (ref 0.7–4.0)
MCHC: 34.1 g/dL (ref 30.0–36.0)
MCV: 93.1 fl (ref 78.0–100.0)
Monocytes Absolute: 0.6 10*3/uL (ref 0.1–1.0)
Monocytes Relative: 13.2 % — ABNORMAL HIGH (ref 3.0–12.0)
Neutro Abs: 2 10*3/uL (ref 1.4–7.7)
Neutrophils Relative %: 44.9 % (ref 43.0–77.0)
Platelets: 335 10*3/uL (ref 150.0–400.0)
RBC: 4.37 Mil/uL (ref 3.87–5.11)
RDW: 12.8 % (ref 11.5–15.5)
WBC: 4.5 10*3/uL (ref 4.0–10.5)

## 2022-06-10 LAB — LIPID PANEL
Cholesterol: 163 mg/dL (ref 0–200)
HDL: 70.7 mg/dL (ref >39.00)
LDL Cholesterol: 80 mg/dL (ref 0–99)
NonHDL: 91.92
Total CHOL/HDL Ratio: 2
Triglycerides: 58 mg/dL (ref 0.0–149.0)
VLDL: 11.6 mg/dL (ref 0.0–40.0)

## 2022-06-10 LAB — VITAMIN B12: Vitamin B-12: 504 pg/mL (ref 211–911)

## 2022-06-10 LAB — COMPREHENSIVE METABOLIC PANEL
ALT: 29 U/L (ref 0–35)
AST: 29 U/L (ref 0–37)
Albumin: 4.2 g/dL (ref 3.5–5.2)
Alkaline Phosphatase: 58 U/L (ref 39–117)
BUN: 17 mg/dL (ref 6–23)
CO2: 29 mEq/L (ref 19–32)
Calcium: 9.6 mg/dL (ref 8.4–10.5)
Chloride: 100 mEq/L (ref 96–112)
Creatinine, Ser: 0.75 mg/dL (ref 0.40–1.20)
GFR: 97.29 mL/min (ref >60.00)
Glucose, Bld: 87 mg/dL (ref 70–99)
Potassium: 3.6 mEq/L (ref 3.5–5.1)
Sodium: 138 mEq/L (ref 135–145)
Total Bilirubin: 0.6 mg/dL (ref 0.2–1.2)
Total Protein: 7.9 g/dL (ref 6.0–8.3)

## 2022-06-10 LAB — T4, FREE: Free T4: 0.83 ng/dL (ref 0.60–1.60)

## 2022-06-10 LAB — TSH: TSH: 0.53 u[IU]/mL (ref 0.35–5.50)

## 2022-06-10 MED ORDER — SEMAGLUTIDE-WEIGHT MANAGEMENT 0.25 MG/0.5ML ~~LOC~~ SOAJ: 0.2500 mg | SUBCUTANEOUS | 0 refills | Status: DC

## 2022-06-10 NOTE — Assessment & Plan Note (Signed)
New Waking up in the middle of the night having difficulty back to sleep Some chronic fatigue as a result Sleep difficulties could be hormonal, but underlying streps is likely contributing Will try melatonin or CBD Can consider trazodone if if above is not effective Stressed regular exercise, stress reduction

## 2022-06-10 NOTE — Assessment & Plan Note (Addendum)
Chronic Encouraged weight loss Advised regular exercise Discussed decreased portions, healthy diet-discussed high-protein, fiber, vegetable diet-low in carbohydrates and fruits Discussed the importance of lifestyle changes even if she ends up taking medication, which we both agree will hopefully be not on long-term medication Wants to help - would like to try wegovy 0.25 mg weekly - reviewed side effects including possible stomach paralysis, pancreatitis, thyroid cancer  Tried in past - phentermine, calorie counting, low carb diet

## 2022-06-10 NOTE — Assessment & Plan Note (Signed)
Chronic Following with endocrine Will order TFTs per her request so she does not have to get stuck again

## 2022-06-10 NOTE — Assessment & Plan Note (Signed)
Chronic ?Check B12 level ?

## 2022-06-10 NOTE — Assessment & Plan Note (Signed)
Chronic Blood pressure well controlled CMP Continue amlodipine 10 mg daily, HCTZ 25 mg daily

## 2022-06-10 NOTE — Assessment & Plan Note (Signed)
Chronic Secondary to hydrochlorothiazide CMP today Continue potassium chloride 40 mill equivalents daily

## 2022-06-19 ENCOUNTER — Other Ambulatory Visit: Payer: Self-pay | Admitting: Internal Medicine

## 2022-06-29 ENCOUNTER — Encounter: Payer: Self-pay | Admitting: Internal Medicine

## 2022-07-05 MED ORDER — PHENTERMINE HCL 37.5 MG PO CAPS: 37.5000 mg | ORAL_CAPSULE | ORAL | 2 refills | Status: DC

## 2022-07-16 ENCOUNTER — Encounter: Payer: Self-pay | Admitting: Internal Medicine

## 2022-07-18 MED ORDER — SERTRALINE HCL 50 MG PO TABS: 50.0000 mg | ORAL_TABLET | Freq: Every day | ORAL | 3 refills | Status: DC

## 2022-07-18 NOTE — Addendum Note (Signed)
Addended by: Binnie Rail on: 07/18/2022 01:37 PM   Modules accepted: Orders

## 2022-08-10 ENCOUNTER — Other Ambulatory Visit: Payer: Self-pay | Admitting: Internal Medicine

## 2022-08-10 ENCOUNTER — Other Ambulatory Visit (INDEPENDENT_AMBULATORY_CARE_PROVIDER_SITE_OTHER): Payer: Commercial Managed Care - PPO

## 2022-08-10 ENCOUNTER — Other Ambulatory Visit: Payer: Self-pay | Admitting: Endocrinology

## 2022-08-10 DIAGNOSIS — E042 Nontoxic multinodular goiter: Secondary | ICD-10-CM | POA: Diagnosis not present

## 2022-08-10 LAB — T4, FREE: Free T4: 0.74 ng/dL (ref 0.60–1.60)

## 2022-08-10 LAB — TSH: TSH: 0.27 u[IU]/mL — ABNORMAL LOW (ref 0.35–5.50)

## 2022-08-13 ENCOUNTER — Ambulatory Visit: Payer: Commercial Managed Care - PPO | Admitting: Endocrinology

## 2022-08-18 ENCOUNTER — Encounter: Payer: Self-pay | Admitting: Internal Medicine

## 2022-08-18 NOTE — Assessment & Plan Note (Signed)
Chronic Weight 06/10/22 - 228 Start phentermine end of January -was on it for 4 weeks Current weight 220  She would like to continue medication No side effects and blood pressure okay Continue phentermine 37.5 mg for the next few months Follow-up in July as scheduled She is exercising regularly-stressed continuing She is making changes in her diet-continue current changes-stressed good amount of protein, fiber is okay, vegetables

## 2022-08-18 NOTE — Progress Notes (Signed)
Subjective:    Patient ID: Chloe Elliott, female    DOB: 03-17-1981, 42 y.o.   MRN: [id number]     HPI Chloe Elliott is here for follow up of her chronic medical problems.  Started phentermine 37.5 mg daily for obesity end of January.    She is walking.  The phentermine has helped decrease appetite and she does not crave sugars as much.  She denies side effects, except for a dry mouth which makes her drink more water.  Blood pressure has been controlled when she goes to appointments.  She feels like the sertraline is helping with her overall stress and anxiety.    Medications and allergies reviewed with patient and updated if appropriate.  Current Outpatient Medications on File Prior to Visit  Medication Sig Dispense Refill   amLODipine (NORVASC) 10 MG tablet TAKE 1 TABLET BY MOUTH EVERY DAY 90 tablet 3   hydrochlorothiazide (HYDRODIURIL) 25 MG tablet TAKE 1 TABLET (25 MG TOTAL) BY MOUTH DAILY. 90 tablet 3   Multiple Vitamin (MULTIVITAMIN WITH MINERALS) TABS tablet Take 1 tablet by mouth in the morning.     phentermine 37.5 MG capsule Take 1 capsule (37.5 mg total) by mouth every morning. 30 capsule 2   potassium chloride SA (KLOR-CON M20) 20 MEQ tablet Take 2 tablets (40 mEq total) by mouth daily. 180 tablet 3   sertraline (ZOLOFT) 50 MG tablet TAKE 1 TABLET BY MOUTH EVERY DAY 90 tablet 3   No current facility-administered medications on file prior to visit.     Review of Systems  HENT:         Dry mouth  Respiratory:  Negative for shortness of breath.   Cardiovascular:  Negative for chest pain and palpitations.  Neurological:  Negative for light-headedness and headaches.       Objective:   Vitals:   08/19/22 1049  BP: 130/72  Pulse: 78  Temp: 98.6 F (37 C)  SpO2: 98%   BP Readings from Last 3 Encounters:  08/19/22 130/72  08/19/22 (!) 140/88  06/10/22 112/80   Wt Readings from Last 3 Encounters:  08/19/22 220 lb (99.8 kg)  08/19/22 221 lb 9.6 oz (100.5 kg)   06/10/22 228 lb (103.4 kg)   Body mass index is 31.57 kg/m.    Physical Exam Constitutional:      General: She is not in acute distress.    Appearance: Normal appearance. She is not ill-appearing.  HENT:     Head: Normocephalic and atraumatic.  Skin:    General: Skin is warm and dry.  Neurological:     Mental Status: She is alert.  Psychiatric:        Mood and Affect: Mood normal.        Behavior: Behavior normal.        Lab Results  Component Value Date   WBC 4.5 06/10/2022   HGB 13.9 06/10/2022   HCT 40.7 06/10/2022   PLT 335.0 06/10/2022   GLUCOSE 87 06/10/2022   CHOL 163 06/10/2022   TRIG 58.0 06/10/2022   HDL 70.70 06/10/2022   LDLCALC 80 06/10/2022   ALT 29 06/10/2022   AST 29 06/10/2022   NA 138 06/10/2022   K 3.6 06/10/2022   CL 100 06/10/2022   CREATININE 0.75 06/10/2022   BUN 17 06/10/2022   CO2 29 06/10/2022   TSH 0.27 (L) 08/10/2022   HGBA1C 5.3 05/24/2020     Assessment & Plan:    See Problem List  for Assessment and Plan of chronic medical problems.

## 2022-08-19 ENCOUNTER — Ambulatory Visit: Payer: Commercial Managed Care - PPO | Admitting: Internal Medicine

## 2022-08-19 ENCOUNTER — Ambulatory Visit: Payer: Commercial Managed Care - PPO | Admitting: Endocrinology

## 2022-08-19 ENCOUNTER — Encounter: Payer: Self-pay | Admitting: Endocrinology

## 2022-08-19 VITALS — BP 130/72 | HR 78 | Temp 98.6°F | Ht 70.0 in | Wt 220.0 lb

## 2022-08-19 VITALS — BP 140/88 | HR 60 | Ht 70.0 in | Wt 221.6 lb

## 2022-08-19 DIAGNOSIS — E669 Obesity, unspecified: Secondary | ICD-10-CM | POA: Diagnosis not present

## 2022-08-19 DIAGNOSIS — F419 Anxiety disorder, unspecified: Secondary | ICD-10-CM | POA: Diagnosis not present

## 2022-08-19 DIAGNOSIS — I10 Essential (primary) hypertension: Secondary | ICD-10-CM

## 2022-08-19 DIAGNOSIS — E042 Nontoxic multinodular goiter: Secondary | ICD-10-CM | POA: Diagnosis not present

## 2022-08-19 MED ORDER — PHENTERMINE HCL 37.5 MG PO CAPS: 37.5000 mg | ORAL_CAPSULE | ORAL | 2 refills | Status: DC

## 2022-08-19 NOTE — Assessment & Plan Note (Signed)
Chronic Blood pressure is well-controlled with the phentermine Continue amlodipine 10 mg daily, hydrochlorothiazide 25 mg daily
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
## 2022-08-19 NOTE — Patient Instructions (Addendum)
      Return for follow up as scheduled.  

## 2022-08-19 NOTE — Assessment & Plan Note (Signed)
Increased anxiety related to her divorce and custody battle Started her on sertraline 50 mg daily since her last visit via MyChart She denies side effects and feels like she is doing well Continue sertraline 50 mg daily

## 2022-10-01 LAB — HM MAMMOGRAPHY

## 2022-10-08 LAB — HM MAMMOGRAPHY

## 2022-12-02 ENCOUNTER — Encounter: Payer: Self-pay | Admitting: Internal Medicine

## 2022-12-02 MED ORDER — SERTRALINE HCL 50 MG PO TABS: 50.0000 mg | ORAL_TABLET | Freq: Every day | ORAL | 3 refills | Status: DC

## 2022-12-09 ENCOUNTER — Ambulatory Visit: Payer: Commercial Managed Care - PPO | Admitting: Internal Medicine

## 2023-03-16 ENCOUNTER — Encounter: Payer: Self-pay | Admitting: Internal Medicine

## 2023-03-16 NOTE — Patient Instructions (Incomplete)
      Blood work was ordered.   The lab is on the first floor.    Medications changes include :       A referral was ordered and someone will call you to schedule an appointment.     No follow-ups on file.  

## 2023-03-16 NOTE — Progress Notes (Unsigned)
Subjective:    Patient ID: Chloe Elliott, female    DOB: [birth year], 42 y.o.   MRN: [id number]     HPI Chloe Elliott is here for follow up of her chronic medical problems.  Obesity -she has gained a lot of weight since she was here last and really feels like she needs something to help with weight loss.  She is exercising some, but knows she needs to do more.  She does have difficulty controlling her portions and that is really something she needs help with.  Swelling in hands, feet -  feels like she is retaining fluid.  It started a little over a week she has noticed it.  She may have had a little more sodium than normal.  No other changes.  No SOB.    Thyroid is larger on right side.  Occ difficulty swallowing.   No change in energy.     Medications and allergies reviewed with patient and updated if appropriate.  Current Outpatient Medications on File Prior to Visit  Medication Sig Dispense Refill   amLODipine (NORVASC) 10 MG tablet TAKE 1 TABLET BY MOUTH EVERY DAY 90 tablet 3   hydrochlorothiazide (HYDRODIURIL) 25 MG tablet TAKE 1 TABLET (25 MG TOTAL) BY MOUTH DAILY. 90 tablet 3   Multiple Vitamin (MULTIVITAMIN WITH MINERALS) TABS tablet Take 1 tablet by mouth in the morning.     potassium chloride SA (KLOR-CON M20) 20 MEQ tablet Take 2 tablets (40 mEq total) by mouth daily. 180 tablet 3   sertraline (ZOLOFT) 50 MG tablet Take 1 tablet (50 mg total) by mouth daily. 90 tablet 3   No current facility-administered medications on file prior to visit.     Review of Systems  Constitutional:  Negative for fever.  Respiratory:  Negative for cough, shortness of breath and wheezing.   Cardiovascular:  Positive for leg swelling (leg and hands - persistent for one week). Negative for chest pain and palpitations.  Neurological:  Negative for light-headedness and headaches.       Objective:   Vitals:   03/17/23 0810  BP: 116/74  Pulse: 70  Temp: 98.3 F (36.8 C)  SpO2: 98%    BP Readings from Last 3 Encounters:  03/17/23 116/74  08/19/22 130/72  08/19/22 (!) 140/88   Wt Readings from Last 3 Encounters:  03/17/23 245 lb (111.1 kg)  08/19/22 220 lb (99.8 kg)  08/19/22 221 lb 9.6 oz (100.5 kg)   Body mass index is 35.15 kg/m.    Physical Exam Constitutional:      General: She is not in acute distress.    Appearance: Normal appearance.  HENT:     Head: Normocephalic and atraumatic.  Eyes:     Conjunctiva/sclera: Conjunctivae normal.  Cardiovascular:     Rate and Rhythm: Normal rate and regular rhythm.     Heart sounds: Normal heart sounds.  Pulmonary:     Effort: Pulmonary effort is normal. No respiratory distress.     Breath sounds: Normal breath sounds. No wheezing.  Musculoskeletal:     Cervical back: Neck supple.     Right lower leg: No edema.     Left lower leg: No edema.  Lymphadenopathy:     Cervical: No cervical adenopathy.  Skin:    General: Skin is warm and dry.     Findings: No rash.  Neurological:     Mental Status: She is alert. Mental status is at baseline.  Psychiatric:  Mood and Affect: Mood normal.        Behavior: Behavior normal.        Lab Results  Component Value Date   WBC 4.5 06/10/2022   HGB 13.9 06/10/2022   HCT 40.7 06/10/2022   PLT 335.0 06/10/2022   GLUCOSE 87 06/10/2022   CHOL 163 06/10/2022   TRIG 58.0 06/10/2022   HDL 70.70 06/10/2022   LDLCALC 80 06/10/2022   ALT 29 06/10/2022   AST 29 06/10/2022   NA 138 06/10/2022   K 3.6 06/10/2022   CL 100 06/10/2022   CREATININE 0.75 06/10/2022   BUN 17 06/10/2022   CO2 29 06/10/2022   TSH 0.27 (L) 08/10/2022   HGBA1C 5.3 05/24/2020     Assessment & Plan:    See Problem List for Assessment and Plan of chronic medical problems.   Flu vaccine given

## 2023-03-17 ENCOUNTER — Other Ambulatory Visit (HOSPITAL_COMMUNITY): Payer: Self-pay

## 2023-03-17 ENCOUNTER — Telehealth: Payer: Self-pay

## 2023-03-17 ENCOUNTER — Encounter: Payer: Self-pay | Admitting: Internal Medicine

## 2023-03-17 ENCOUNTER — Ambulatory Visit: Payer: Commercial Managed Care - PPO | Admitting: Internal Medicine

## 2023-03-17 VITALS — BP 116/74 | HR 70 | Temp 98.3°F | Ht 70.0 in | Wt 245.0 lb

## 2023-03-17 DIAGNOSIS — E010 Iodine-deficiency related diffuse (endemic) goiter: Secondary | ICD-10-CM | POA: Diagnosis not present

## 2023-03-17 DIAGNOSIS — Z23 Encounter for immunization: Secondary | ICD-10-CM

## 2023-03-17 DIAGNOSIS — E669 Obesity, unspecified: Secondary | ICD-10-CM

## 2023-03-17 DIAGNOSIS — R609 Edema, unspecified: Secondary | ICD-10-CM | POA: Diagnosis not present

## 2023-03-17 DIAGNOSIS — I10 Essential (primary) hypertension: Secondary | ICD-10-CM | POA: Diagnosis not present

## 2023-03-17 LAB — COMPREHENSIVE METABOLIC PANEL
ALT: 21 U/L (ref 0–35)
AST: 21 U/L (ref 0–37)
Albumin: 4.1 g/dL (ref 3.5–5.2)
Alkaline Phosphatase: 79 U/L (ref 39–117)
BUN: 10 mg/dL (ref 6–23)
CO2: 30 meq/L (ref 19–32)
Calcium: 9.4 mg/dL (ref 8.4–10.5)
Chloride: 100 meq/L (ref 96–112)
Creatinine, Ser: 0.72 mg/dL (ref 0.40–1.20)
GFR: 101.63 mL/min (ref >60.00)
Glucose, Bld: 83 mg/dL (ref 70–99)
Potassium: 3.4 meq/L — ABNORMAL LOW (ref 3.5–5.1)
Sodium: 138 meq/L (ref 135–145)
Total Bilirubin: 0.7 mg/dL (ref 0.2–1.2)
Total Protein: 7.4 g/dL (ref 6.0–8.3)

## 2023-03-17 LAB — TSH: TSH: 0.3 u[IU]/mL — ABNORMAL LOW (ref 0.35–5.50)

## 2023-03-17 MED ORDER — FUROSEMIDE 20 MG PO TABS: 20.0000 mg | ORAL_TABLET | Freq: Every day | ORAL | 0 refills | Status: DC | PRN

## 2023-03-17 MED ORDER — QSYMIA 3.75-23 MG PO CP24: 1.0000 | ORAL_CAPSULE | Freq: Every day | ORAL | 0 refills | Status: DC

## 2023-03-17 NOTE — Telephone Encounter (Signed)
PA request has been Submitted. New Encounter created for follow up. For additional info see Pharmacy Prior Auth telephone encounter from 03/17/23.

## 2023-03-17 NOTE — Assessment & Plan Note (Signed)
Chronic With hypertension, thyromegaly, family history of diabetes Has gained weight over the past 6 months or so She is exercising some, but knows she needs to be doing more-will work on increasing her exercise Decrease portions - advised diet high in protein, veges and fiber Has been on phentermine 37.5 mg - not effective Discussed options Start qsymia 3.75-23 mg daily - discussed possible side effects Will titrate dose as tolerated

## 2023-03-17 NOTE — Assessment & Plan Note (Signed)
New She has been having fluid retention-some swelling in her hands and lower legs Most likely related to increase in salt intake, weight gain, possibly the amlodipine Start Lasix 20 mg daily as needed-will hold hydrochlorothiazide when she takes this Decrease salt intake Will work on weight loss CMP

## 2023-03-17 NOTE — Assessment & Plan Note (Signed)
Chronic Blood pressure very well-controlled here today She is experiencing some fluid retention, but no shortness of breath, chest pain or palpitations Amlodipine could be contributing, but she has been on this for a long time and I think more likely it is related to some increase in sodium intake and weight gain No change in medications Continue amlodipine 10 mg daily, HCTZ 25 mg daily Will be starting Lasix 20 mg daily as needed-when she takes this she will hold her hydrochlorothiazide CMP

## 2023-03-17 NOTE — Telephone Encounter (Signed)
Pharmacy Patient Advocate Encounter  Received notification from [facility] that Prior Authorization for Qsymia 3.75-23MG  er capsules  has been DENIED.  Full denial letter will be uploaded to the media tab. See denial reason below.   PA #/Case ID/Reference #: [id number]   DENIAL REASON: Plan exclusion

## 2023-03-17 NOTE — Telephone Encounter (Signed)
Pharmacy Patient Advocate Encounter   Received notification from Pt Calls Messages that prior authorization for Qsymia 3.75-23MG  er capsules is required/requested.   Insurance verification completed.   The patient is insured through [facility] .   Per test claim: PA required; PA submitted to [facility] via CoverMyMeds Key/confirmation #/EOC [id number] Status is pending

## 2023-03-17 NOTE — Assessment & Plan Note (Signed)
Chronic Following with endocrine-establishes with new endocrinologist in February She feels the thyroid gland has gotten larger and has had occasional dysphagia Ultrasound of thyroid ordered Will check TSH

## 2023-03-18 MED ORDER — PHENTERMINE HCL 37.5 MG PO CAPS: 37.5000 mg | ORAL_CAPSULE | ORAL | 2 refills | Status: DC

## 2023-03-18 NOTE — Telephone Encounter (Signed)
Patient notified via mychart

## 2023-03-18 NOTE — Telephone Encounter (Signed)
Please let patient know

## 2023-04-12 ENCOUNTER — Other Ambulatory Visit: Payer: Self-pay | Admitting: Internal Medicine
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
## 2023-05-17 ENCOUNTER — Encounter: Payer: Self-pay | Admitting: Internal Medicine

## 2023-05-17 ENCOUNTER — Ambulatory Visit: Payer: Commercial Managed Care - PPO | Admitting: Internal Medicine

## 2023-05-17 VITALS — BP 128/74 | HR 79 | Temp 98.6°F | Ht 70.0 in | Wt 240.0 lb

## 2023-05-17 DIAGNOSIS — E669 Obesity, unspecified: Secondary | ICD-10-CM

## 2023-05-17 DIAGNOSIS — R609 Edema, unspecified: Secondary | ICD-10-CM

## 2023-05-17 DIAGNOSIS — T300 Burn of unspecified body region, unspecified degree: Secondary | ICD-10-CM | POA: Diagnosis not present

## 2023-05-17 MED ORDER — CEPHALEXIN 500 MG PO CAPS: 500.0000 mg | ORAL_CAPSULE | Freq: Three times a day (TID) | ORAL | 0 refills | Status: AC

## 2023-05-17 MED ORDER — TOPIRAMATE 25 MG PO TABS: 25.0000 mg | ORAL_TABLET | Freq: Every day | ORAL | 2 refills | Status: DC
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
## 2023-05-17 NOTE — Patient Instructions (Addendum)
       Medications changes include :   start topamax 25 mg daily.  Keflex three times a day.       Return in about 3 months (around 08/15/2023) for follow up.

## 2023-05-17 NOTE — Assessment & Plan Note (Signed)
Chronic Has lost weight since she was here last I using the phentermine She would like to try something to help her and her process Will add Topamax 25 mg daily-discussed some possible side effects and she will call if she has any concerning symptoms Continue regular exercise, continue healthy diet Follow-up in 3 months

## 2023-06-15 ENCOUNTER — Other Ambulatory Visit: Payer: Self-pay | Admitting: Internal Medicine

## 2023-07-09 ENCOUNTER — Other Ambulatory Visit: Payer: Self-pay | Admitting: Internal Medicine

## 2023-07-12 ENCOUNTER — Telehealth: Payer: Self-pay

## 2023-07-12 DIAGNOSIS — E042 Nontoxic multinodular goiter: Secondary | ICD-10-CM

## 2023-07-12 NOTE — Telephone Encounter (Signed)
 Orders Placed This Encounter  Procedures   TSH   T4, free   T3, free

## 2023-07-14 ENCOUNTER — Other Ambulatory Visit: Payer: Commercial Managed Care - PPO

## 2023-07-15 ENCOUNTER — Encounter: Payer: Self-pay | Admitting: Endocrinology

## 2023-07-15 LAB — T4, FREE: Free T4: 1 ng/dL (ref 0.8–1.8)

## 2023-07-15 LAB — T3, FREE: T3, Free: 3.4 pg/mL (ref 2.3–4.2)

## 2023-07-15 LAB — TSH: TSH: 0.3 m[IU]/L — ABNORMAL LOW

## 2023-07-20 ENCOUNTER — Ambulatory Visit: Payer: Commercial Managed Care - PPO | Admitting: Endocrinology

## 2023-07-22 ENCOUNTER — Other Ambulatory Visit: Payer: Self-pay | Admitting: Internal Medicine

## 2023-07-27 ENCOUNTER — Ambulatory Visit: Payer: Commercial Managed Care - PPO | Admitting: Endocrinology

## 2023-07-27 ENCOUNTER — Encounter: Payer: Self-pay | Admitting: Endocrinology

## 2023-07-27 VITALS — BP 140/90 | HR 81 | Resp 20 | Ht 70.0 in | Wt 238.8 lb

## 2023-07-27 DIAGNOSIS — R7989 Other specified abnormal findings of blood chemistry: Secondary | ICD-10-CM | POA: Diagnosis not present

## 2023-07-27 DIAGNOSIS — E042 Nontoxic multinodular goiter: Secondary | ICD-10-CM

## 2023-07-27 NOTE — Patient Instructions (Signed)
 Plan fro US thyroid to monitor thyroid nodules.
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
## 2023-08-02 ENCOUNTER — Other Ambulatory Visit: Payer: Self-pay | Admitting: Internal Medicine

## 2023-08-15 ENCOUNTER — Encounter: Payer: Self-pay | Admitting: Internal Medicine

## 2023-08-15 DIAGNOSIS — D242 Benign neoplasm of left breast: Secondary | ICD-10-CM | POA: Insufficient documentation

## 2023-08-15 NOTE — Progress Notes (Unsigned)
 Subjective:    Patient ID: Chloe Elliott, female    DOB: [birth year], 43 y.o.   MRN: [id number]     HPI Rhetta is here for follow up of her chronic medical problems.  No concerns.    Medications and allergies reviewed with patient and updated if appropriate.  Current Outpatient Medications on File Prior to Visit  Medication Sig Dispense Refill   amLODipine (NORVASC) 10 MG tablet TAKE 1 TABLET BY MOUTH EVERY DAY 90 tablet 3   furosemide (LASIX) 20 MG tablet TAKE 1 TABLET BY MOUTH EVERY DAY AS NEEDED 90 tablet 1   hydrochlorothiazide (HYDRODIURIL) 25 MG tablet TAKE 1 TABLET (25 MG TOTAL) BY MOUTH DAILY. 90 tablet 3   KLOR-CON M20 20 MEQ tablet TAKE 2 TABLETS BY MOUTH DAILY 180 tablet 3   Multiple Vitamin (MULTIVITAMIN WITH MINERALS) TABS tablet Take 1 tablet by mouth in the morning.     phentermine 37.5 MG capsule TAKE 1 CAPSULE BY MOUTH EVERY MORNING 30 capsule 0   sertraline (ZOLOFT) 50 MG tablet Take 1 tablet (50 mg total) by mouth daily. 90 tablet 3   topiramate (TOPAMAX) 25 MG tablet TAKE 1 TABLET (25 MG TOTAL) BY MOUTH DAILY. 90 tablet 1   No current facility-administered medications on file prior to visit.     Review of Systems  Constitutional:  Negative for fever.  Respiratory:  Negative for cough, shortness of breath and wheezing.   Cardiovascular:  Negative for chest pain, palpitations and leg swelling (mild at times).  Neurological:  Negative for light-headedness and headaches.       Objective:   Vitals:   08/16/23 0812  BP: 112/74  Pulse: 76  Temp: 97.6 F (36.4 C)  SpO2: 100%   BP Readings from Last 3 Encounters:  08/16/23 112/74  07/27/23 (!) 140/90  05/17/23 128/74   Wt Readings from Last 3 Encounters:  08/16/23 238 lb (108 kg)  07/27/23 238 lb 12.8 oz (108.3 kg)  05/17/23 240 lb (108.9 kg)   Body mass index is 34.15 kg/m.    Physical Exam Constitutional:      General: She is not in acute distress.    Appearance: Normal appearance.   HENT:     Head: Normocephalic and atraumatic.  Eyes:     Conjunctiva/sclera: Conjunctivae normal.  Neck:     Comments: thyromegaly Cardiovascular:     Rate and Rhythm: Normal rate and regular rhythm.     Heart sounds: Normal heart sounds.  Pulmonary:     Effort: Pulmonary effort is normal. No respiratory distress.     Breath sounds: Normal breath sounds. No wheezing.  Musculoskeletal:     Cervical back: Neck supple.     Right lower leg: No edema.     Left lower leg: No edema.  Lymphadenopathy:     Cervical: No cervical adenopathy.  Skin:    General: Skin is warm and dry.     Findings: No rash.  Neurological:     Mental Status: She is alert. Mental status is at baseline.  Psychiatric:        Mood and Affect: Mood normal.        Behavior: Behavior normal.        Lab Results  Component Value Date   WBC 4.5 06/10/2022   HGB 13.9 06/10/2022   HCT 40.7 06/10/2022   PLT 335.0 06/10/2022   GLUCOSE 83 03/17/2023   CHOL 163 06/10/2022   TRIG 58.0 06/10/2022  HDL 70.70 06/10/2022   LDLCALC 80 06/10/2022   ALT 21 03/17/2023   AST 21 03/17/2023   NA 138 03/17/2023   K 3.4 (L) 03/17/2023   CL 100 03/17/2023   CREATININE 0.72 03/17/2023   BUN 10 03/17/2023   CO2 30 03/17/2023   TSH 0.30 (L) 07/14/2023   HGBA1C 5.3 05/24/2020     Assessment & Plan:    See Problem List for Assessment and Plan of chronic medical problems.

## 2023-08-15 NOTE — Patient Instructions (Addendum)
      Blood work was ordered.       Medications changes include :   None     Return in about 6 months (around 02/16/2024) for Physical Exam.

## 2023-08-15 NOTE — Assessment & Plan Note (Signed)
 S/p lumpectomy and radioactive seed implantation

## 2023-08-16 ENCOUNTER — Telehealth: Payer: Self-pay

## 2023-08-16 ENCOUNTER — Ambulatory Visit (INDEPENDENT_AMBULATORY_CARE_PROVIDER_SITE_OTHER): Payer: Commercial Managed Care - PPO | Admitting: Internal Medicine

## 2023-08-16 VITALS — BP 112/74 | HR 76 | Temp 97.6°F | Ht 70.0 in | Wt 238.0 lb

## 2023-08-16 DIAGNOSIS — E669 Obesity, unspecified: Secondary | ICD-10-CM

## 2023-08-16 DIAGNOSIS — I10 Essential (primary) hypertension: Secondary | ICD-10-CM

## 2023-08-16 DIAGNOSIS — F419 Anxiety disorder, unspecified: Secondary | ICD-10-CM | POA: Diagnosis not present

## 2023-08-16 DIAGNOSIS — D242 Benign neoplasm of left breast: Secondary | ICD-10-CM

## 2023-08-16 DIAGNOSIS — E876 Hypokalemia: Secondary | ICD-10-CM

## 2023-08-16 DIAGNOSIS — E538 Deficiency of other specified B group vitamins: Secondary | ICD-10-CM

## 2023-08-16 DIAGNOSIS — Z1211 Encounter for screening for malignant neoplasm of colon: Secondary | ICD-10-CM

## 2023-08-16 DIAGNOSIS — R609 Edema, unspecified: Secondary | ICD-10-CM

## 2023-08-16 LAB — COMPREHENSIVE METABOLIC PANEL
ALT: 21 U/L (ref 0–35)
AST: 21 U/L (ref 0–37)
Albumin: 3.9 g/dL (ref 3.5–5.2)
Alkaline Phosphatase: 68 U/L (ref 39–117)
BUN: 11 mg/dL (ref 6–23)
CO2: 27 meq/L (ref 19–32)
Calcium: 9.1 mg/dL (ref 8.4–10.5)
Chloride: 104 meq/L (ref 96–112)
Creatinine, Ser: 0.83 mg/dL (ref 0.40–1.20)
GFR: 85.44 mL/min (ref >60.00)
Glucose, Bld: 90 mg/dL (ref 70–99)
Potassium: 2.7 meq/L — CL (ref 3.5–5.1)
Sodium: 140 meq/L (ref 135–145)
Total Bilirubin: 0.4 mg/dL (ref 0.2–1.2)
Total Protein: 7.4 g/dL (ref 6.0–8.3)

## 2023-08-16 LAB — CBC WITH DIFFERENTIAL/PLATELET
Basophils Absolute: 0.1 10*3/uL (ref 0.0–0.1)
Basophils Relative: 1.8 % (ref 0.0–3.0)
Eosinophils Absolute: 0.2 10*3/uL (ref 0.0–0.7)
Eosinophils Relative: 4.4 % (ref 0.0–5.0)
HCT: 37.9 % (ref 36.0–46.0)
Hemoglobin: 12.9 g/dL (ref 12.0–15.0)
Lymphocytes Relative: 43.2 % (ref 12.0–46.0)
Lymphs Abs: 1.5 10*3/uL (ref 0.7–4.0)
MCHC: 34.1 g/dL (ref 30.0–36.0)
MCV: 92.2 fl (ref 78.0–100.0)
Monocytes Absolute: 0.4 10*3/uL (ref 0.1–1.0)
Monocytes Relative: 12.7 % — ABNORMAL HIGH (ref 3.0–12.0)
Neutro Abs: 1.3 10*3/uL — ABNORMAL LOW (ref 1.4–7.7)
Neutrophils Relative %: 37.9 % — ABNORMAL LOW (ref 43.0–77.0)
Platelets: 364 10*3/uL (ref 150.0–400.0)
RBC: 4.11 Mil/uL (ref 3.87–5.11)
RDW: 12.4 % (ref 11.5–15.5)
WBC: 3.5 10*3/uL — ABNORMAL LOW (ref 4.0–10.5)

## 2023-08-16 LAB — VITAMIN B12: Vitamin B-12: 394 pg/mL (ref 211–911)

## 2023-08-16 MED ORDER — PHENTERMINE HCL 37.5 MG PO CAPS: 37.5000 mg | ORAL_CAPSULE | Freq: Every morning | ORAL | 0 refills | Status: DC

## 2023-08-16 MED ORDER — POTASSIUM CHLORIDE CRYS ER 20 MEQ PO TBCR: 40.0000 meq | EXTENDED_RELEASE_TABLET | Freq: Every day | ORAL | 3 refills | Status: AC

## 2023-08-16 NOTE — Assessment & Plan Note (Addendum)
 Chronic Working on weight loss Did lose weight, but regained it Continue phentermine 37.5 mg daily and topamax 25 mg daily Will try topamax 25 mg bid to see if that helps Will restart regular exercise Continue healthy diet

## 2023-08-16 NOTE — Assessment & Plan Note (Signed)
 Subacute mild Continue Lasix 20 mg daily as needed-will hold hydrochlorothiazide when she takes this Decrease salt intake Working on weight loss CMP

## 2023-08-16 NOTE — Telephone Encounter (Signed)
 Called pt and relayed results and recommendations, pt verbalized understanding

## 2023-08-16 NOTE — Assessment & Plan Note (Signed)
Chronic Secondary to hydrochlorothiazide CMP today Continue potassium chloride 40 mill equivalents daily

## 2023-08-16 NOTE — Telephone Encounter (Signed)
 Please call her and let her know her potassium was very low and she needs to pick up her potassium today and start taking it.  Other blood work-kidney function, liver tests and blood counts are normal.  Vitamin B12 level is normal, but she should try to supplement to see if that improves her energy.

## 2023-08-16 NOTE — Assessment & Plan Note (Signed)
Chronic.  Controlled.  Continue sertraline 50 mg daily

## 2023-08-16 NOTE — Assessment & Plan Note (Signed)
 Chronic Blood pressure very well-controlled  Continue amlodipine 10 mg daily, HCTZ 25 mg daily ( hold when taking lasix 20 mg daily prn) CMP, cbc

## 2023-08-16 NOTE — Assessment & Plan Note (Signed)
Chronic ?Check B12 level ?

## 2023-08-16 NOTE — Telephone Encounter (Signed)
 CRITICAL VALUE STICKER  CRITICAL VALUE: potassium 2.7  RECEIVER (on-site recipient of call): Chloe Elliott  DATE & TIME NOTIFIED: 08/16/23 11:11 am  MESSENGER (representative from lab):Hope  MD NOTIFIED: Dr. Lawerance Bach

## 2023-08-17 ENCOUNTER — Encounter: Payer: Self-pay | Admitting: Internal Medicine

## 2023-08-18 MED ORDER — PHENTERMINE HCL 37.5 MG PO CAPS: 37.5000 mg | ORAL_CAPSULE | Freq: Every morning | ORAL | 0 refills | Status: DC

## 2023-10-07 LAB — HM MAMMOGRAPHY

## 2023-10-08 ENCOUNTER — Encounter: Payer: Self-pay | Admitting: Internal Medicine

## 2023-11-22 ENCOUNTER — Encounter: Payer: Self-pay | Admitting: Internal Medicine

## 2023-11-23 MED ORDER — PHENTERMINE HCL 37.5 MG PO CAPS: 37.5000 mg | ORAL_CAPSULE | Freq: Every morning | ORAL | 0 refills | Status: DC

## 2023-12-04 MED ORDER — PHENTERMINE HCL 37.5 MG PO CAPS: 37.5000 mg | ORAL_CAPSULE | Freq: Every morning | ORAL | 0 refills | Status: DC

## 2023-12-04 NOTE — Addendum Note (Signed)
 Addended by: GEOFM GLADE PARAS on: 12/04/2023 10:54 AM   Modules accepted: Orders

## 2024-02-15 ENCOUNTER — Other Ambulatory Visit: Payer: Self-pay | Admitting: Internal Medicine

## 2024-02-16 ENCOUNTER — Other Ambulatory Visit: Payer: Self-pay | Admitting: Internal Medicine

## 2024-02-17 ENCOUNTER — Encounter: Admitting: Internal Medicine

## 2024-04-24 ENCOUNTER — Encounter: Payer: Self-pay | Admitting: Internal Medicine

## 2024-05-01 ENCOUNTER — Ambulatory Visit (AMBULATORY_SURGERY_CENTER): Admitting: *Deleted

## 2024-05-01 VITALS — Ht 70.0 in | Wt 240.0 lb

## 2024-05-01 DIAGNOSIS — Z1211 Encounter for screening for malignant neoplasm of colon: Secondary | ICD-10-CM

## 2024-05-01 MED ORDER — NA SULFATE-K SULFATE-MG SULF 17.5-3.13-1.6 GM/177ML PO SOLN: 1.0000 | Freq: Once | ORAL | 0 refills | Status: AC

## 2024-05-01 NOTE — Progress Notes (Signed)
 Pt's name and DOB verified at the beginning of the pre-visit with 2 identifiers  Pt denies any difficulty with ambulating,sitting, laying down or rolling side to side  Pt has no issues moving head neck or swallowing  No egg or soy allergy known to patient   No issues known to pt with past sedation  No FH of Malignant Hyperthermia  Pt is not on home 02   Pt is not on blood thinners   Pt denies issues with constipation   Pt is not on dialysis  Pt denise any abnormal heart rhythms   Pt denies any upcoming cardiac testing  Patient's chart reviewed by Norleen Schillings CNRA prior to pre-visit and patient appropriate for the [facility].  Pre-visit completed and red dot placed by patient's name on their procedure day (on provider's sche  Visit by phone  Pt states weight is 240 lb  Pt given  both [facility] main # and MD on call # prior to instructions.  Informed pt to come in at the time discussed and is shown on PV instructions.  Pt instructed to use Singlecare.com or GoodRx for a price reduction on prep  Instructed pt where to find PV instructions in My Chart. . Instructed pt on all aspects of written instructions including med holds clothing to wear and foods to eat and not eat as well as after procedure legal restrictions and to call MD on call if needed.. Pt states understanding. Instructed pt to review instructions again prior to procedure and call main # given if has any questions or any issues. Pt states they will.

## 2024-05-09 ENCOUNTER — Encounter: Payer: Self-pay | Admitting: Internal Medicine

## 2024-05-10 ENCOUNTER — Encounter: Payer: Self-pay | Admitting: Internal Medicine

## 2024-05-15 ENCOUNTER — Encounter: Payer: Self-pay | Admitting: Internal Medicine

## 2024-05-15 ENCOUNTER — Ambulatory Visit: Admitting: Internal Medicine

## 2024-05-15 ENCOUNTER — Encounter: Admitting: Internal Medicine

## 2024-05-15 VITALS — BP 135/68 | HR 67 | Temp 97.3°F | Resp 18 | Ht 70.0 in | Wt 240.0 lb

## 2024-05-15 DIAGNOSIS — D128 Benign neoplasm of rectum: Secondary | ICD-10-CM

## 2024-05-15 DIAGNOSIS — Z1211 Encounter for screening for malignant neoplasm of colon: Secondary | ICD-10-CM

## 2024-05-15 MED ORDER — SODIUM CHLORIDE 0.9 % IV SOLN: 500.0000 mL | INTRAVENOUS | Status: DC

## 2024-05-15 NOTE — Progress Notes (Signed)
 Transferred to PACU via stretcher.  Not responding to stimulation at this time.  VSS upon leaving procedure room.

## 2024-05-15 NOTE — Patient Instructions (Addendum)

## 2024-05-15 NOTE — Progress Notes (Signed)
 Pt stated that she ate a hamburger yesterday before starting prep for colonoscopy. Reported to MD and CRNA. Will proceed with assessment and procedure per MD.

## 2024-05-15 NOTE — Op Note (Signed)
 [facility] Patient Name: Chloe Elliott Procedure Date: 05/15/2024 9:21 AM MRN: [id number] Endoscopist: Norleen SAILOR. Abran , MD, [id number] Age: 43 Referring MD:  Date of Birth: 11/04/1980 Gender: Female Account #: [id number] Procedure:                Colonoscopy with cold snare polypectomy x 1 Indications:              Screening for colorectal malignant neoplasm Medicines:                Monitored Anesthesia Care Procedure:                Pre-Anesthesia Assessment:                           - Prior to the procedure, a History and Physical                            was performed, and patient medications and                            allergies were reviewed. The patient's tolerance of                            previous anesthesia was also reviewed. The risks                            and benefits of the procedure and the sedation                            options and risks were discussed with the patient.                            All questions were answered, and informed consent                            was obtained. Prior Anticoagulants: The patient has                            taken no anticoagulant or antiplatelet agents. ASA                            Grade Assessment: II - A patient with mild systemic                            disease. After reviewing the risks and benefits,                            the patient was deemed in satisfactory condition to                            undergo the procedure.                           After obtaining informed consent, the colonoscope  was passed under direct vision. Throughout the                            procedure, the patient's blood pressure, pulse, and                            oxygen saturations were monitored continuously. The                            Colonoscope was introduced through the anus and                            advanced to the the cecum, identified by                             appendiceal orifice and ileocecal valve. The                            ileocecal valve, appendiceal orifice, and rectum                            were photographed. The quality of the bowel                            preparation was excellent. The colonoscopy was                            performed without difficulty. The patient tolerated                            the procedure well. The bowel preparation used was                            SUPREP via split dose instruction. Scope In: 9:35:58 AM Scope Out: 9:51:22 AM Scope Withdrawal Time: 0 hours 12 minutes 3 seconds  Total Procedure Duration: 0 hours 15 minutes 24 seconds  Findings:                 A 5 mm polyp was found in the rectum. The polyp was                            removed with a cold snare. Resection and retrieval                            were complete.                           The exam was otherwise without abnormality on                            direct and retroflexion views. Complications:            No immediate complications. Estimated blood loss:  None. Estimated Blood Loss:     Estimated blood loss: none. Impression:               - One 5 mm polyp in the rectum, removed with a cold                            snare. Resected and retrieved.                           - The examination was otherwise normal on direct                            and retroflexion views. Recommendation:           - Repeat colonoscopy in 7-10 years for surveillance.                           - Patient has a contact number available for                            emergencies. The signs and symptoms of potential                            delayed complications were discussed with the                            patient. Return to normal activities tomorrow.                            Written discharge instructions were provided to the                            patient.                           - Resume  previous diet.                           - Continue present medications.                           - Await pathology results. Norleen SAILOR. Abran, MD 05/15/2024 9:56:05 AM This report has been signed electronically.
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
## 2024-05-15 NOTE — Progress Notes (Signed)
 Called to room to assist during endoscopic procedure.  Patient ID and intended procedure confirmed with present staff. Received instructions for my participation in the procedure from the performing physician.

## 2024-05-15 NOTE — Progress Notes (Signed)
 Pt's states no medical or surgical changes since previsit or office visit.

## 2024-05-16 ENCOUNTER — Encounter: Payer: Self-pay | Admitting: Internal Medicine

## 2024-05-16 ENCOUNTER — Telehealth: Payer: Self-pay

## 2024-05-16 DIAGNOSIS — R0683 Snoring: Secondary | ICD-10-CM | POA: Insufficient documentation

## 2024-05-16 NOTE — Progress Notes (Unsigned)
    Subjective:    Patient ID: Chloe Elliott, female    DOB: [birth year], 43 y.o.   MRN: [id number]      HPI Chloe Elliott is here for No chief complaint on file.  Her significant other has observed excessive snoring and irregular breathing while she is asleep.  She would like to be evaluated further for sleep apnea.      Medications and allergies reviewed with patient and updated if appropriate.  Current Outpatient Medications on File Prior to Visit  Medication Sig Dispense Refill   amLODipine  (NORVASC ) 10 MG tablet TAKE 1 TABLET BY MOUTH EVERY DAY 90 tablet 3   celecoxib (CELEBREX) 100 MG capsule as needed.     furosemide  (LASIX ) 20 MG tablet TAKE 1 TABLET BY MOUTH EVERY DAY AS NEEDED 90 tablet 1   hydrochlorothiazide  (HYDRODIURIL ) 25 MG tablet TAKE 1 TABLET (25 MG TOTAL) BY MOUTH DAILY. 90 tablet 3   Multiple Vitamin (MULTIVITAMIN WITH MINERALS) TABS tablet Take 1 tablet by mouth in the morning.     Na Sulfate-K Sulfate-Mg Sulfate concentrate (SUPREP) 17.5-3.13-1.6 GM/177ML SOLN SMARTSIG:1 Kit(s) By Mouth Once     potassium chloride  SA (KLOR-CON  M20) 20 MEQ tablet Take 2 tablets (40 mEq total) by mouth daily. 180 tablet 3   semaglutide -weight management (WEGOVY ) 0.25 MG/0.5ML SOAJ SQ injection Inject 0.25 mg into the skin once a week. Monday     sertraline  (ZOLOFT ) 50 MG tablet TAKE 1 TABLET BY MOUTH EVERY DAY 90 tablet 3   No current facility-administered medications on file prior to visit.    Review of Systems     Objective:  There were no vitals filed for this visit. BP Readings from Last 3 Encounters:  05/15/24 135/68  08/16/23 112/74  07/27/23 (!) 140/90   Wt Readings from Last 3 Encounters:  05/15/24 240 lb (108.9 kg)  05/01/24 240 lb (108.9 kg)  08/16/23 238 lb (108 kg)   There is no height or weight on file to calculate BMI.    Physical Exam         Assessment & Plan:    See Problem List for Assessment and Plan of chronic medical problems.

## 2024-05-16 NOTE — Patient Instructions (Addendum)
      Medications changes include :   None    A referral was ordered for neurology-[facility] and someone will call you to schedule an appointment.     Return for follow up as scheduled.

## 2024-05-16 NOTE — Telephone Encounter (Signed)
  Follow up Call-     05/15/2024    8:16 AM  Call back number  Post procedure Call Back phone  # [phone]  Permission to leave phone message Yes     Patient questions:  Do you have a fever, pain , or abdominal swelling? No. Pain Score  0 *  Have you tolerated food without any problems? Yes.    Have you been able to return to your normal activities? Yes.    Do you have any questions about your discharge instructions: Diet   No. Medications  No. Follow up visit  No.  Do you have questions or concerns about your Care? No.  Actions: * If pain score is 4 or above: No action needed, pain <4.

## 2024-05-17 ENCOUNTER — Ambulatory Visit: Admitting: Internal Medicine

## 2024-05-17 ENCOUNTER — Ambulatory Visit: Payer: Self-pay | Admitting: Internal Medicine

## 2024-05-17 VITALS — BP 120/80 | HR 73 | Temp 97.9°F | Ht 70.0 in | Wt 249.2 lb

## 2024-05-17 DIAGNOSIS — R0683 Snoring: Secondary | ICD-10-CM | POA: Diagnosis not present

## 2024-05-17 DIAGNOSIS — I10 Essential (primary) hypertension: Secondary | ICD-10-CM | POA: Diagnosis not present

## 2024-05-17 LAB — SURGICAL PATHOLOGY

## 2024-05-17 NOTE — Assessment & Plan Note (Signed)
 Chronic Snoring has gotten worse over the years and her significant other has noticed apnea Does state fatigue during the day Just started on Wegovy -has had difficulty losing weight Referral to neurology for evaluation of sleep apnea

## 2024-05-17 NOTE — Assessment & Plan Note (Signed)
 Chronic Secondary to hydrochlorothiazide  BMP today Continue potassium chloride  40 mill equivalents daily

## 2024-05-17 NOTE — Assessment & Plan Note (Addendum)
 Chronic Blood pressure very well-controlled  Continue amlodipine  10 mg daily, HCTZ 25 mg daily

## 2024-06-29 ENCOUNTER — Other Ambulatory Visit: Payer: Self-pay | Admitting: Internal Medicine

## 2024-07-21 ENCOUNTER — Other Ambulatory Visit: Payer: Commercial Managed Care - PPO

## 2024-07-26 ENCOUNTER — Ambulatory Visit: Payer: Commercial Managed Care - PPO | Admitting: Endocrinology

## 2024-07-26 ENCOUNTER — Encounter: Admitting: Internal Medicine
